# Patient Record
Sex: Female | Born: 1965 | Race: White | Hispanic: No | Marital: Married | Smoking: Never smoker
Health system: Southern US, Community
[De-identification: ages and names within clinical notes are randomized; demographics above are authoritative.]

## PROBLEM LIST (undated history)

## (undated) DIAGNOSIS — M7918 Myalgia, other site: Secondary | ICD-10-CM

## (undated) DIAGNOSIS — Z9861 Coronary angioplasty status: Principal | ICD-10-CM

## (undated) DIAGNOSIS — Z955 Presence of coronary angioplasty implant and graft: Secondary | ICD-10-CM

## (undated) DIAGNOSIS — E785 Hyperlipidemia, unspecified: Secondary | ICD-10-CM

## (undated) DIAGNOSIS — I214 Non-ST elevation (NSTEMI) myocardial infarction: Secondary | ICD-10-CM

## (undated) DIAGNOSIS — R55 Syncope and collapse: Secondary | ICD-10-CM

## (undated) DIAGNOSIS — Z8701 Personal history of pneumonia (recurrent): Secondary | ICD-10-CM

## (undated) DIAGNOSIS — I2510 Atherosclerotic heart disease of native coronary artery without angina pectoris: Principal | ICD-10-CM

## (undated) DIAGNOSIS — F419 Anxiety disorder, unspecified: Secondary | ICD-10-CM

## (undated) HISTORY — DX: Anxiety disorder, unspecified: F41.9

## (undated) HISTORY — DX: Coronary angioplasty status: Z98.61

## (undated) HISTORY — DX: Presence of coronary angioplasty implant and graft: Z95.5

## (undated) HISTORY — DX: Hyperlipidemia, unspecified: E78.5

## (undated) HISTORY — DX: Atherosclerotic heart disease of native coronary artery without angina pectoris: I25.10

## (undated) HISTORY — PX: FINGER FRACTURE SURGERY: SHX638

## (undated) HISTORY — DX: Syncope and collapse: R55

## (undated) HISTORY — DX: Personal history of pneumonia (recurrent): Z87.01

---

## 1998-10-08 ENCOUNTER — Other Ambulatory Visit: Admission: RE | Admit: 1998-10-08 | Discharge: 1998-10-08 | Payer: Self-pay | Admitting: Obstetrics and Gynecology

## 2006-05-06 ENCOUNTER — Emergency Department (HOSPITAL_COMMUNITY): Admission: EM | Admit: 2006-05-06 | Discharge: 2006-05-06 | Payer: Self-pay | Admitting: Family Medicine

## 2006-11-09 HISTORY — PX: FINGER FRACTURE SURGERY: SHX638

## 2007-07-07 ENCOUNTER — Emergency Department (HOSPITAL_COMMUNITY): Admission: EM | Admit: 2007-07-07 | Discharge: 2007-07-07 | Payer: Self-pay | Admitting: Family Medicine

## 2007-07-09 ENCOUNTER — Ambulatory Visit (HOSPITAL_COMMUNITY): Admission: RE | Admit: 2007-07-09 | Discharge: 2007-07-09 | Payer: Self-pay | Admitting: Orthopaedic Surgery

## 2008-10-21 ENCOUNTER — Inpatient Hospital Stay (HOSPITAL_COMMUNITY): Admission: AD | Admit: 2008-10-21 | Discharge: 2008-10-23 | Payer: Self-pay | Admitting: Obstetrics and Gynecology

## 2011-03-24 NOTE — Op Note (Signed)
Robin Tapia, Robin Tapia           ACCOUNT NO.:  [id number]   MEDICAL RECORD NO.:  [id number]          PATIENT TYPE:  AMB   LOCATION:  SDS                          FACILITY:  [facility]   PHYSICIAN:  Vanita Panda. Magnus Ivan, M.D.DATE OF BIRTH:  04-25-1966   DATE OF PROCEDURE:  07/09/2007  DATE OF DISCHARGE:                               OPERATIVE REPORT   PREOPERATIVE DIAGNOSIS:  Left ring finger middle phalanx condylar  fracture.   POSTOPERATIVE DIAGNOSIS:  Left ring finger middle phalanx condylar  fracture.   PROCEDURE:  Open reduction and pinning, left ring finger middle phalanx  condylar fracture.   SURGEON:  Vanita Panda. Magnus Ivan, M.D.   ANESTHESIA:  General.   ANTIBIOTICS:  1 gram of IV Ancef.   TOURNIQUET TIME:  1 hour and 48 minutes.   COMPLICATIONS:  None.   INDICATIONS:  Briefly Ms. Gauer is a 45 year old female who 4 weeks  ago sustained an injury to her ring finger when a medicine ball hit the  finger when she was working out.  She had obvious bruising and deformity  of the finger but did not seek treatment until this week.  X-rays were  obtained at [facility]. She was found to have a healing  fracture at DIP joint of the middle phalanx and this was a condylar  fracture and there was a significant stepoff.  Her finger showed a  mallet deformity as well as an ulnar-based deformity. It was recommended  she undergo open reduction and pinning with the knowledge that this was  a much more difficult case with the amount of healing that already had  taken place.  The risks and benefits were explained to her and well  understood and she agreed to surgery.   DESCRIPTION OF PROCEDURE:  After informed consent was obtained, the  appropriate left hand was marked, she was brought to the operating room  and placed supine on the operating table.  General anesthesia was then  obtained. Her hand was prepped and draped with DuraPrep and sterile  drapes, a nonsterile  tourniquet was placed around her arm. A time-out  was called and she was identified as the correct patient and correct  extremity. I then used an Esmarch to wrap out the arm, tourniquet  inflated to 250 mm pressure.  I then made an incision along the dorsal  ulnar border of the finger near the DIP joint. I was able to carry this  down between the soft tissues and assess the condylar fracture.  There  was an obvious step-off but healing tissue was easily encountered. I had  to use a small osteotome and a rongeur to free the fracture piece up to  then meticulously elevate this up to a higher position.  I then used two  0.028 K-wires in a crossing pattern to hold the fracture in a reduced  position.  This was all accomplished under fluoroscopic guidance and I  was pleased at the reduction following the pinning. Both pins were cut  outside the skin and the wound was copiously irrigated. I closed the  skin with interrupted 4-0 Prolene suture.  The pins were bent.  Xeroform  was placed over the incision followed by a well-padded sterile dressing,  Coban and a finger splint. A digital block was obtained with Marcaine.  At the end of the case, the tourniquet was let down. The patient was  awakened, extubated and taken to the recovery room in stable condition.  Postoperatively I told her I would like her to limit her activities with  her left hand to allow this to heal and to not remove the splinter  dressings until she is seen in the office next weeks.      Vanita Panda. Magnus Ivan, M.D.  Electronically Signed     CYB/MEDQ  D:  07/09/2007  T:  07/10/2007  Job:  [id number]

## 2011-07-29 ENCOUNTER — Ambulatory Visit (INDEPENDENT_AMBULATORY_CARE_PROVIDER_SITE_OTHER): Payer: Managed Care, Other (non HMO) | Admitting: Sports Medicine

## 2011-07-29 ENCOUNTER — Encounter: Payer: Self-pay | Admitting: Sports Medicine

## 2011-07-29 VITALS — BP 130/88 | HR 62 | Ht 65.0 in | Wt 140.0 lb

## 2011-07-29 DIAGNOSIS — M79672 Pain in left foot: Secondary | ICD-10-CM | POA: Insufficient documentation

## 2011-07-29 DIAGNOSIS — M79609 Pain in unspecified limb: Secondary | ICD-10-CM

## 2011-07-29 DIAGNOSIS — M216X9 Other acquired deformities of unspecified foot: Secondary | ICD-10-CM

## 2011-07-29 DIAGNOSIS — Q667 Congenital pes cavus, unspecified foot: Secondary | ICD-10-CM | POA: Insufficient documentation

## 2011-07-29 NOTE — Assessment & Plan Note (Signed)
She has used spenco inserts since age 45 rigid orthotics f podiatrist were painful  I suspect she will need cushioned insole w arch support long term or custom orthotics that are softer

## 2011-07-29 NOTE — Assessment & Plan Note (Signed)
Likely ligament injury on left dorsal foot at the navicular talus junction.  At this time, no true stress fracture seen and history does not match.   Pt given arch strap with her very high arches and will also try wearing sport insoles and scaphoid pads.  Will wear for 3-4 weeks Pt can run as tolerated  Ice as needed Pt given red flags to look out for incase involves into a stress fracture.

## 2011-07-29 NOTE — Progress Notes (Signed)
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  Pt states she did do a trail run with her family and did notice a little pain and some discomfort and that was about 2 weeks ago. Pt denies any swelling or discoloration.  Patient though does state that sometimes she feels that she has some numbness in her toes but denies any type of weakness. The patient has had a history of SI joint pain but this does not relate to it and denies any type of back or hip pain.  Patient is still able to bear weight and is continuing to do her exercises including some speed training just the other day.  Physical Exam: Ankle: No visible erythema or swelling. Range of motion is full in all directions. Strength is 5/5 in all directions. Stable lateral and medial ligaments; squeeze test and kleiger test unremarkable; Talar dome minimal tender  And tender over the navicular bone on the dorsal aspect.; No pain at base of 5th MT; No tenderness over cuboid; No tenderness on posterior aspects of lateral and medial malleolus No sign of peroneal tendon subluxations; Negative tarsal tunnel tinel's Able to walk 4 steps.  patient does  Negative bounce test - can do 10 hops  Running Gait appears normal forefoot strike with loud impact  Arch height is 3.7 cms to navicular and very cavus foot with haglund's changes bilat  MSK U/s:  On exam patient's talar dome appears to be unremarkable with no areas of stress injury or erythema.  Over the navicular bone at the navicular talus junction patient does have an area of likely ligamental strain showing some stress injury to the navicular bone but no true stress fracture seen patient does have some mild neovascularization occurring and minimal edema in this area.  Patient's extensor tendons all appear to be intact with no  swelling,  Patients abductor tendon has a bifurcation but no swelling or no tears seen.

## 2011-08-14 LAB — CBC
HCT: 35.2 % — ABNORMAL LOW (ref 36.0–46.0)
MCHC: 34.3 g/dL (ref 30.0–36.0)
Platelets: 225 10*3/uL (ref 150–400)
RBC: 4.02 MIL/uL (ref 3.87–5.11)
RDW: 14 % (ref 11.5–15.5)
WBC: 11.7 10*3/uL — ABNORMAL HIGH (ref 4.0–10.5)

## 2011-08-14 LAB — RPR: RPR Ser Ql: NONREACTIVE

## 2011-08-21 LAB — CBC
HCT: 43.3
Hemoglobin: 14.7
RBC: 4.76
WBC: 7.6

## 2013-03-12 HISTORY — PX: CORONARY ANGIOPLASTY WITH STENT PLACEMENT: SHX49

## 2013-04-09 DIAGNOSIS — Z9861 Coronary angioplasty status: Secondary | ICD-10-CM

## 2013-04-09 DIAGNOSIS — I2510 Atherosclerotic heart disease of native coronary artery without angina pectoris: Secondary | ICD-10-CM

## 2013-04-09 HISTORY — DX: Atherosclerotic heart disease of native coronary artery without angina pectoris: Z98.61

## 2013-04-09 HISTORY — DX: Atherosclerotic heart disease of native coronary artery without angina pectoris: I25.10

## 2013-04-11 ENCOUNTER — Emergency Department (HOSPITAL_COMMUNITY)
Admission: EM | Admit: 2013-04-11 | Discharge: 2013-04-11 | Disposition: A | Discharge status: Nursing Home (Includes ICF) | Payer: Managed Care, Other (non HMO) | Source: Home / Self Care | Attending: Emergency Medicine | Admitting: Emergency Medicine

## 2013-04-11 ENCOUNTER — Encounter (HOSPITAL_COMMUNITY): Payer: Self-pay | Admitting: *Deleted

## 2013-04-11 ENCOUNTER — Encounter (HOSPITAL_COMMUNITY): Payer: Self-pay | Admitting: Emergency Medicine

## 2013-04-11 ENCOUNTER — Emergency Department (HOSPITAL_COMMUNITY): Payer: Managed Care, Other (non HMO)

## 2013-04-11 ENCOUNTER — Inpatient Hospital Stay (HOSPITAL_COMMUNITY)
Admission: EM | Admit: 2013-04-11 | Discharge: 2013-04-14 | DRG: 247 | Disposition: A | Discharge status: Home / Self Care | Payer: Managed Care, Other (non HMO) | Attending: Internal Medicine | Admitting: Internal Medicine

## 2013-04-11 DIAGNOSIS — R079 Chest pain, unspecified: Secondary | ICD-10-CM

## 2013-04-11 DIAGNOSIS — I9589 Other hypotension: Secondary | ICD-10-CM | POA: Diagnosis not present

## 2013-04-11 DIAGNOSIS — R778 Other specified abnormalities of plasma proteins: Secondary | ICD-10-CM

## 2013-04-11 DIAGNOSIS — I214 Non-ST elevation (NSTEMI) myocardial infarction: Secondary | ICD-10-CM

## 2013-04-11 DIAGNOSIS — I519 Heart disease, unspecified: Secondary | ICD-10-CM | POA: Diagnosis not present

## 2013-04-11 DIAGNOSIS — Y840 Cardiac catheterization as the cause of abnormal reaction of the patient, or of later complication, without mention of misadventure at the time of the procedure: Secondary | ICD-10-CM | POA: Diagnosis not present

## 2013-04-11 DIAGNOSIS — R7989 Other specified abnormal findings of blood chemistry: Secondary | ICD-10-CM

## 2013-04-11 DIAGNOSIS — I209 Angina pectoris, unspecified: Secondary | ICD-10-CM

## 2013-04-11 DIAGNOSIS — I2510 Atherosclerotic heart disease of native coronary artery without angina pectoris: Secondary | ICD-10-CM | POA: Diagnosis present

## 2013-04-11 DIAGNOSIS — Z955 Presence of coronary angioplasty implant and graft: Secondary | ICD-10-CM

## 2013-04-11 DIAGNOSIS — I498 Other specified cardiac arrhythmias: Secondary | ICD-10-CM | POA: Diagnosis not present

## 2013-04-11 HISTORY — DX: Myalgia, other site: M79.18

## 2013-04-11 HISTORY — DX: Non-ST elevation (NSTEMI) myocardial infarction: I21.4

## 2013-04-11 LAB — CBC
HCT: 41.4 % (ref 36.0–46.0)
Hemoglobin: 14.8 g/dL (ref 12.0–15.0)
MCH: 31.2 pg (ref 26.0–34.0)
MCV: 87.3 fL (ref 78.0–100.0)
RBC: 4.74 MIL/uL (ref 3.87–5.11)

## 2013-04-11 LAB — COMPREHENSIVE METABOLIC PANEL
ALT: 18 U/L (ref 0–35)
AST: 29 U/L (ref 0–37)
Albumin: 3.9 g/dL (ref 3.5–5.2)
CO2: 24 mEq/L (ref 19–32)
Calcium: 9.3 mg/dL (ref 8.4–10.5)
Sodium: 140 mEq/L (ref 135–145)
Total Protein: 7.1 g/dL (ref 6.0–8.3)

## 2013-04-11 LAB — CK TOTAL AND CKMB (NOT AT ARMC): Total CK: 275 U/L — ABNORMAL HIGH (ref 7–177)

## 2013-04-11 LAB — BASIC METABOLIC PANEL
CO2: 26 mEq/L (ref 19–32)
Calcium: 9.3 mg/dL (ref 8.4–10.5)
Creatinine, Ser: 0.73 mg/dL (ref 0.50–1.10)
Glucose, Bld: 93 mg/dL (ref 70–99)

## 2013-04-11 LAB — TROPONIN I: Troponin I: 0.93 ng/mL (ref <0.30)

## 2013-04-11 MED ORDER — SODIUM CHLORIDE 0.9 % IV SOLN: INTRAVENOUS | Status: DC

## 2013-04-11 MED ORDER — ALPRAZOLAM 0.25 MG PO TABS
0.2500 mg | ORAL_TABLET | Freq: Two times a day (BID) | ORAL | Status: DC | PRN
  Administered 2013-04-12: 0.25 mg via ORAL
  Filled 2013-04-11 (×2): qty 1

## 2013-04-11 MED ORDER — VITAMIN C 250 MG PO TABS
250.0000 mg | ORAL_TABLET | Freq: Every day | ORAL | Status: DC
  Filled 2013-04-11 (×4): qty 1

## 2013-04-11 MED ORDER — HEPARIN BOLUS VIA INFUSION
4000.0000 [IU] | Freq: Once | INTRAVENOUS | Status: AC
  Administered 2013-04-11: 4000 [IU] via INTRAVENOUS
  Filled 2013-04-11: qty 4000

## 2013-04-11 MED ORDER — ACETAMINOPHEN 325 MG PO TABS: 650.0000 mg | ORAL_TABLET | ORAL | Status: DC | PRN

## 2013-04-11 MED ORDER — NITROGLYCERIN 0.4 MG SL SUBL: 0.4000 mg | SUBLINGUAL_TABLET | SUBLINGUAL | Status: DC | PRN

## 2013-04-11 MED ORDER — HEPARIN (PORCINE) IN NACL 100-0.45 UNIT/ML-% IJ SOLN
900.0000 [IU]/h | INTRAMUSCULAR | Status: DC
  Administered 2013-04-11: 900 [IU]/h via INTRAVENOUS
  Filled 2013-04-11: qty 250

## 2013-04-11 MED ORDER — ONDANSETRON HCL 4 MG/2ML IJ SOLN: 4.0000 mg | Freq: Four times a day (QID) | INTRAMUSCULAR | Status: DC | PRN

## 2013-04-11 MED ORDER — ASPIRIN 81 MG PO CHEW
CHEWABLE_TABLET | ORAL | Status: AC
  Filled 2013-04-11: qty 2

## 2013-04-11 MED ORDER — ASPIRIN 81 MG PO CHEW
162.0000 mg | CHEWABLE_TABLET | Freq: Once | ORAL | Status: AC
  Administered 2013-04-11: 162 mg via ORAL

## 2013-04-11 MED ORDER — ZOLPIDEM TARTRATE 5 MG PO TABS
5.0000 mg | ORAL_TABLET | Freq: Once | ORAL | Status: AC
  Administered 2013-04-11: 5 mg via ORAL
  Filled 2013-04-11: qty 1

## 2013-04-11 MED ORDER — ASPIRIN EC 81 MG PO TBEC
81.0000 mg | DELAYED_RELEASE_TABLET | Freq: Every day | ORAL | Status: DC
  Filled 2013-04-11: qty 1

## 2013-04-11 MED ORDER — ZOLPIDEM TARTRATE 5 MG PO TABS
5.0000 mg | ORAL_TABLET | Freq: Every evening | ORAL | Status: DC | PRN
  Administered 2013-04-12 – 2013-04-13 (×2): 5 mg via ORAL
  Filled 2013-04-11 (×2): qty 1

## 2013-04-11 NOTE — ED Notes (Signed)
Sheldon, MD at bedside.  

## 2013-04-11 NOTE — ED Notes (Signed)
Pt  Reports    Chest  Pain     Described       As  A  Tightness      In  Chest    And  An  Inability  yest to  Take  A  Deep breath      After  Exertion  -  Pt is  A  Runner       And  Has  Been  Training   -  She  Reports  Earlier today  After treadmill  She  Developed  A  Sensation     Of     Not  Being  able  To take  Deep  Breath    She  Also  Reports   A    Strange      Sensation      Described  As    A  Pain   At  This  Time  She  Is  Awake  alert  And  Oriented

## 2013-04-11 NOTE — ED Notes (Signed)
Pt reports left chest and upper back pain radiating to left arm after running intervals yesterday and this morning. Pt reports pain was relieved somewhat with deep breathing. Pt currently NAd, alert, oriented x4. Transferred from urgent care with normal EKG for further eval.

## 2013-04-11 NOTE — ED Notes (Signed)
Results of i-stat troponin given to Dr. Bernette Mayers

## 2013-04-11 NOTE — Consult Note (Signed)
Pt. Seen and examined. Agree with the NP/PA-C note as written.  Called to the ER to see an unassigned 47 yo female with no significant medical problems in the past. As she relates the story, she has been "training hard" for a series of half-marathons (one with her daughter in [place]). She was running on the treadmill this morning and noticed left peri-scapular pain, this went around to her chest and she noted "aching" in her left arm.  She was doing sprint training at 12 mph. When she stopped exercising, her symptoms went away. She went back to exercise and they became apparent again. She has never had symptoms like this before. She did not that she did some overhead painting for her daughter recently and has been lifting some heavier weights than usual.  She presented to [facility] initially. Her EKG is normal, without ischemia. POC troponin was obtained and was slightly elevated at 0.09. Other labs were WNL. Currently she is chest pain free. She has refused heparin. Exam significant for point tenderness under the medial edge of the left scapula - pressure on this point does relieve some of her arm ache. Otherwise, exam is benign.   Impression: 1. Possible unstable angina - vs. Musculoskeletal chest pain/radiculopathy 2. Elevated troponin - may represent ACS vs. Skeletal myopathy  Plan: 1.  I would like to gather more information before rendering a diagnosis. Her symptoms sound like unstable angina, however, her EKG, CXR and exam argue against it. POC troponin is elevated. It would be helpful to get a lab standard troponin, as well as CK and CKMB to r/o rhabdomyolysis or skeletal myopathy. If troponin remains or becomes more positive, then we have to consider her as "failing her stress test" and LHC is warranted tomorrow morning. If her tests become negative, she could probably have further outpatient work-up of her chest wall pain.  Haniyyah Sakuma C. Heily Carlucci, MD, FACC Attending Cardiologist [facility]   

## 2013-04-11 NOTE — ED Notes (Signed)
Pt refuses IV at this time, Bernette Mayers, MD notified

## 2013-04-11 NOTE — Consult Note (Signed)
Reason for Consult: Chest Pain  Referring Physician: ER Physician   HPI: The patient is a 47 y/o female, that presented to the [facility] ER today with a complaint of exertional chest pain that began today. She has no cardiac risk factors. No past medical history. She is a physically active, physically fit female. 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 She reports that she was running on the treadmill this morning and experienced exertional chest pressure while doing interval training. She has never experienced this type of discomfort before while exercising. She notes that she had associated left hand pressure and left scapular pain. The pain was relieved with rest and then recurred with continued running. She took two ASA at home and then reported to the [facility] after the pain failed to resolve. She was instructed to come to the ER. Her resting EKG was normal. POC troponin was minimally elevated at 0.09. She currently denies chest pain, but endorses left scapular pain with palpation. No SOB, diaphoresis, n/v, or presyncope. She denies any family history of early CAD, MI or SCD.  History reviewed. No pertinent past medical history.  History reviewed. No pertinent past surgical history.  Family History  Problem Relation Age of Onset  . Hypertension Father     Social History:  reports that she has never smoked. She has never used smokeless tobacco. She reports that she does not drink alcohol or use illicit drugs.  Allergies: No Known Allergies  Medications: Prior to Admission:  Prior to Admission medications   Medication Sig Start Date End Date Taking? Authorizing Provider  Ascorbic Acid (VITAMIN C PO) Take 1 tablet by mouth daily.   Yes Historical Provider, MD     Results for orders placed during the hospital encounter of 04/11/13 (from the past 48 hour(s))  CBC     Status: None   Collection Time    04/11/13 12:57 PM      Result Value Range   WBC 9.0  4.0 - 10.5 K/uL    RBC 4.74  3.87 - 5.11 MIL/uL   Hemoglobin 14.8  12.0 - 15.0 g/dL   HCT 16.1  09.6 - 04.5 %   MCV 87.3  78.0 - 100.0 fL   MCH 31.2  26.0 - 34.0 pg   MCHC 35.7  30.0 - 36.0 g/dL   RDW 40.9  81.1 - 91.4 %   Platelets 260  150 - 400 K/uL  BASIC METABOLIC PANEL     Status: None   Collection Time    04/11/13 12:57 PM      Result Value Range   Sodium 137  135 - 145 mEq/L   Potassium 4.3  3.5 - 5.1 mEq/L   Chloride 103  96 - 112 mEq/L   CO2 26  19 - 32 mEq/L   Glucose, Bld 93  70 - 99 mg/dL   BUN 17  6 - 23 mg/dL   Creatinine, Ser 7.82  0.50 - 1.10 mg/dL   Calcium 9.3  8.4 - 95.6 mg/dL   GFR calc non Af Amer >90  >90 mL/min   GFR calc Af Amer >90  >90 mL/min   Comment:            The eGFR has been calculated     using the CKD EPI equation.     This calculation has not been     validated in all clinical     situations.     eGFR's persistently     <  90 mL/min signify     possible Chronic Kidney Disease.  POCT I-STAT TROPONIN I     Status: Abnormal   Collection Time    04/11/13  1:56 PM      Result Value Range   Troponin i, poc 0.09 (*) 0.00 - 0.08 ng/mL   Comment NOTIFIED PHYSICIAN     Comment 3            Comment: Due to the release kinetics of cTnI,     a negative result within the first hours     of the onset of symptoms does not rule out     myocardial infarction with certainty.     If myocardial infarction is still suspected,     repeat the test at appropriate intervals.    No results found.  Review of Systems  Constitutional: Negative for diaphoresis.  Respiratory: Negative for shortness of breath.   Cardiovascular: Positive for chest pain.  Gastrointestinal: Negative for nausea and vomiting.  Musculoskeletal: Positive for myalgias and back pain.  Neurological: Negative for loss of consciousness.   Blood pressure 130/83, pulse 79, temperature 98.3 F (36.8 C), resp. rate 18, weight 138 lb (62.596 kg), last menstrual period 03/21/2013, SpO2 100.00%. Physical Exam   Constitutional: She is oriented to person, place, and time. She appears well-developed and well-nourished. No distress.  HENT:  Head: Normocephalic and atraumatic.  Cardiovascular: Normal rate, regular rhythm, normal heart sounds and intact distal pulses.  Exam reveals no gallop and no friction rub.   No murmur heard. Respiratory: Effort normal and breath sounds normal. No respiratory distress. She has no wheezes. She has no rales. She exhibits no tenderness.  Musculoskeletal: She exhibits no edema.  Neurological: She is alert and oriented to person, place, and time.  Skin: Skin is warm and dry. She is not diaphoretic.  Psychiatric: She has a normal mood and affect. Her behavior is normal.    Assessment/Plan: Principal Problem:   Chest pain with moderate risk for cardiac etiology  Plan: Currently CP free. Resting EKG normal. POC troponin minimally elevated at 0.09. Pt is refusing heparin. Will admit to observation and continue w/u to rule out MI. Will cycle troponin's, as well as CK and CKMB. Will order a CMP. Will obtain a repeat EKG in the AM.   Robin Tapia. SIMMONS, PA-C 04/11/2013, 3:57 PM

## 2013-04-11 NOTE — ED Notes (Signed)
Portable Xray at bedside.

## 2013-04-11 NOTE — Progress Notes (Signed)
CRITICAL VALUE ALERT  Critical value received:  Troponin 0.93       CKMB 20.4       Total 275  Date of notification:  04/11/13  Time of notification:  2040  Critical value read back:yes  Nurse who received alert:  Nikki Dom, RN  MD notified (1st page):  Wilburt Finlay, PA  Time of first page:  2040  MD notified (2nd page): n/a  Time of second page: n/a  Responding MD:  Wilburt Finlay, PA  Time MD responded:  2045  IV heparin started

## 2013-04-11 NOTE — ED Provider Notes (Signed)
History     CSN: [id number]  Arrival date & time 04/11/13  1242   First MD Initiated Contact with Patient 04/11/13 1427      Chief Complaint  Patient presents with  . Chest Pain    (Consider location/radiation/quality/duration/timing/severity/associated sxs/prior treatment) Patient is a 47 y.o. female presenting with chest pain.  Chest Pain  Pt with no significant PMH or FH reports she was running several sprints on treadmill this AM. States she had episodes of chest tightness and back pain after each one. Some SOB and sweating she attributed to running hard. She had similar episodes each time she ran, improved with resting between runs. She states on her way home she had aching pain in L arm. She is asymptomatic now, went to [facility] and was sent to the ED for further evaluation. No CAD or PE risk factors. PERC neg.    History reviewed. No pertinent past medical history.  History reviewed. No pertinent past surgical history.  History reviewed. No pertinent family history.  History  Substance Use Topics  . Smoking status: Never Smoker   . Smokeless tobacco: Never Used  . Alcohol Use: No    OB History   Grav Para Term Preterm Abortions TAB SAB Ect Mult Living                  Review of Systems  Cardiovascular: Positive for chest pain.   All other systems reviewed and are negative except as noted in HPI.    Allergies  Review of patient's allergies indicates no known allergies.  Home Medications   Current Outpatient Rx  Name  Route  Sig  Dispense  Refill  . Ascorbic Acid (VITAMIN C PO)   Oral   Take 1 tablet by mouth daily.           BP 144/82  Pulse 70  Temp(Src) 98.3 F (36.8 C)  Resp 16  Wt 138 lb (62.596 kg)  BMI 22.96 kg/m2  SpO2 100%  LMP 03/21/2013  Physical Exam  Nursing note and vitals reviewed. Constitutional: She is oriented to person, place, and time. She appears well-developed and well-nourished.  HENT:  Head: Normocephalic and atraumatic.   Eyes: EOM are normal. Pupils are equal, round, and reactive to light.  Neck: Normal range of motion. Neck supple.  Cardiovascular: Normal rate, normal heart sounds and intact distal pulses.   Pulmonary/Chest: Effort normal and breath sounds normal.  Abdominal: Bowel sounds are normal. She exhibits no distension. There is no tenderness.  Musculoskeletal: Normal range of motion. She exhibits no edema and no tenderness.  Neurological: She is alert and oriented to person, place, and time. She has normal strength. No cranial nerve deficit or sensory deficit.  Skin: Skin is warm and dry. No rash noted.  Psychiatric: She has a normal mood and affect.    ED Course  Procedures (including critical care time)  Labs Reviewed  POCT I-STAT TROPONIN I - Abnormal; Notable for the following:    Troponin i, poc 0.09 (*)    All other components within normal limits  CBC  BASIC METABOLIC PANEL   No results found.   1. NSTEMI (non-ST elevated myocardial infarction)       MDM   Date: 04/11/2013  Rate: 77  Rhythm: normal sinus rhythm  QRS Axis: normal  Intervals: normal  ST/T Wave abnormalities: normal  Conduction Disutrbances: none  Narrative Interpretation: unremarkable  Pt asymptomatic now, EKG normal, however symptoms are concerning for angina and POC  Trop is positive. Discussed this finding with the patient and need for cardiology evaluation which she is amenable to, however, she refuses IV insertion for heparin. She understands that heparin may prevent continued myocardiac damage, but states he feels fine now and doesn't want IV. Discussed with [facility] on call for cardiology who will see the patient.           Raney Antwine B. Bernette Mayers, MD 04/11/13 1550

## 2013-04-11 NOTE — Progress Notes (Signed)
ANTICOAGULATION CONSULT NOTE - Initial Consult  Pharmacy Consult for heparin Indication: chest pain/ACS  No Known Allergies  Patient Measurements: Height: 5\' 5"  (165.1 cm) Weight: 141 lb 4.8 oz (64.093 kg) IBW/kg (Calculated) : 57 Heparin Dosing Weight: 64 kg  Vital Signs: Temp: 98.6 F (37 C) (06/03 1750) Temp src: Oral (06/03 1750) BP: 127/79 mmHg (06/03 1750) Pulse Rate: 60 (06/03 1750)  Labs:  Recent Labs  04/11/13 1257 04/11/13 1805 04/11/13 1806  HGB 14.8  --   --   HCT 41.4  --   --   PLT 260  --   --   CREATININE 0.73 0.71  --   CKTOTAL  --   --  275*  CKMB  --   --  20.4*  TROPONINI  --   --  0.93*    Estimated Creatinine Clearance: 78.2 ml/min (by C-G formula based on Cr of 0.71).   Medical History: Past Medical History  Diagnosis Date  . Pneumonia ~ 2002    'walking" (04/11/2013)  . Piriformis muscle pain     LLE (04/11/2013)    Medications:  Prescriptions prior to admission  Medication Sig Dispense Refill  . Ascorbic Acid (VITAMIN C PO) Take 1 tablet by mouth daily.        Assessment: 47 year old woman admitted for chest pain and elevated troponins to start on heparin infusion. Goal of Therapy:  Heparin level 0.3-0.7 units/ml Monitor platelets by anticoagulation protocol: Yes   Plan:  Give 4000 units bolus x 1 Start heparin infusion at 900 units/hr Check anti-Xa level in 6 hours and daily while on heparin Continue to monitor H&H and platelets  Mickeal Skinner 04/11/2013,8:19 PM

## 2013-04-11 NOTE — ED Provider Notes (Addendum)
Chief Complaint:   Chief Complaint  Patient presents with  . Chest Pain    History of Present Illness:   Robin Tapia is a 47 year old female runner with no risk factors who had episode yesterday and today of chest tightness and pressure while running. The discomfort first began in the left upper back and radiated to the left pectoral area then down the left arm yesterday while running and lasted 40 minutes. It was associated with slight nausea and shortness of breath. After she stopped running the discomfort went away completely. Today she tried some less strenuous running and had the same discomfort recur again, lasting about 30 minutes at all. Right now she is pain-free. She has no history of cardiac disease. No history of high blood pressure, diabetes, high cholesterol, smoking, or family history of heart disease.  Review of Systems:  Other than noted above, the patient denies any of the following symptoms. Systemic:  No fever, chills, sweats, or fatigue. ENT:  No nasal congestion, rhinorrhea, or sore throat. Pulmonary:  No cough, wheezing, shortness of breath, sputum production, hemoptysis. Cardiac:  No palpitations, rapid heartbeat, dizziness, presyncope or syncope. GI:  No abdominal pain, heartburn, nausea, or vomiting. Ext:  No leg pain or swelling.  PMFSH:  Past medical history, family history, social history, meds, and allergies were reviewed and updated as needed.   Physical Exam:   Vital signs:  BP 127/76  Pulse 75  Temp(Src) 97.9 F (36.6 C) (Oral)  Resp 16  SpO2 100%  LMP 03/21/2013 Gen:  Alert, oriented, in no distress, skin warm and dry. Eye:  PERRL, lids and conjunctivas normal.  Sclera non-icteric. ENT:  Mucous membranes moist, pharynx clear. Neck:  Supple, no adenopathy or tenderness.  No JVD. Lungs:  Clear to auscultation, no wheezes, rales or rhonchi.  No respiratory distress. Heart:  Regular rhythm.  No gallops, murmers, clicks or rubs. Chest:  No chest  wall tenderness. Abdomen:  Soft, nontender, no organomegaly or mass.  Bowel sounds normal.  No pulsatile abdominal mass or bruit. Ext:  No edema.  No calf tenderness and Homann's sign negative.  Pulses full and equal. Skin:  Warm and dry.  No rash.  EKG:   Date: 04/11/2013  Rate: 77  Rhythm: normal sinus rhythm  QRS Axis: normal  Intervals: normal  ST/T Wave abnormalities: normal  Conduction Disutrbances:none  Narrative Interpretation: Normal sinus rhythm, normal EKG.  Old EKG Reviewed: none available  Course in Urgent Care Center:   She was started on IV normal saline at 50 mL per hour monitored, given oxygen at 2-3 minutes, and given aspirin 162 mg, since she had already taken 2 baby aspirin at home. She'll be transferred to the emergency department by [facility].  Assessment:  The encounter diagnosis was Angina pectoris.  She has no risk factors and she is a runner, but her story is very suspicious for angina. The other possibility would be pulmonary embolus.  Plan:   1.  The following meds were prescribed:   New Prescriptions   No medications on file   2.  The patient was transferred to the emergency department via [facility] in stable condition.  Medical Decision Making:  47 year old female with no risk factors had left upper chest and left upper back tightness yesterday and today while running.  Lasted 40 minutes, associated with nausea and shortness of breath.  Radiated down left arm.  EKG was normal, but history suspicious for angina.    Dineen Kid  Lorenz Coaster, MD 04/11/13 1218  Addendum: The patient declines transfer by [facility]. She also declines IV, oxygen, and monitor. Her husband is with her and will drive her to the emergency room or, if we can talk her into it we'll send her by shuttle. I told her that this was against our protocol and against my better judgment, but she is adamant and refuses to be transferred in the usual manner.  Reuben Likes, MD 04/11/13 1228

## 2013-04-11 NOTE — ED Notes (Signed)
Attempted to call report x 1  

## 2013-04-12 ENCOUNTER — Encounter (HOSPITAL_COMMUNITY)
Admission: EM | Disposition: A | Discharge status: Home / Self Care | Payer: Self-pay | Source: Home / Self Care | Attending: Internal Medicine

## 2013-04-12 DIAGNOSIS — I214 Non-ST elevation (NSTEMI) myocardial infarction: Secondary | ICD-10-CM

## 2013-04-12 DIAGNOSIS — I2510 Atherosclerotic heart disease of native coronary artery without angina pectoris: Secondary | ICD-10-CM

## 2013-04-12 DIAGNOSIS — Z955 Presence of coronary angioplasty implant and graft: Secondary | ICD-10-CM

## 2013-04-12 HISTORY — PX: LEFT HEART CATHETERIZATION WITH CORONARY ANGIOGRAM: SHX5451

## 2013-04-12 HISTORY — DX: Non-ST elevation (NSTEMI) myocardial infarction: I21.4

## 2013-04-12 HISTORY — DX: Presence of coronary angioplasty implant and graft: Z95.5

## 2013-04-12 LAB — CK TOTAL AND CKMB (NOT AT ARMC)
CK, MB: 37 ng/mL (ref 0.3–4.0)
CK, MB: 38.7 ng/mL (ref 0.3–4.0)
Relative Index: 7.7 — ABNORMAL HIGH (ref 0.0–2.5)
Relative Index: 7.1 — ABNORMAL HIGH (ref 0.0–2.5)
Total CK: 543 U/L — ABNORMAL HIGH (ref 7–177)

## 2013-04-12 LAB — BASIC METABOLIC PANEL
CO2: 23 mEq/L (ref 19–32)
Calcium: 9 mg/dL (ref 8.4–10.5)
Creatinine, Ser: 0.79 mg/dL (ref 0.50–1.10)
GFR calc Af Amer: >90 mL/min (ref >90)
GFR calc non Af Amer: >90 mL/min (ref >90)
Sodium: 139 mEq/L (ref 135–145)

## 2013-04-12 LAB — LIPID PANEL
Cholesterol: 196 mg/dL (ref 0–200)
Triglycerides: 151 mg/dL — ABNORMAL HIGH (ref <150)
VLDL: 30 mg/dL (ref 0–40)

## 2013-04-12 LAB — HEPARIN LEVEL (UNFRACTIONATED): Heparin Unfractionated: 0.36 IU/mL (ref 0.30–0.70)

## 2013-04-12 LAB — PREGNANCY, URINE: Preg Test, Ur: NEGATIVE

## 2013-04-12 LAB — TROPONIN I
Troponin I: 7.2 ng/mL (ref <0.30)
Troponin I: 9.95 ng/mL (ref <0.30)

## 2013-04-12 LAB — POCT ACTIVATED CLOTTING TIME: Activated Clotting Time: 415 seconds

## 2013-04-12 LAB — PROTIME-INR: INR: 1.04 (ref 0.00–1.49)

## 2013-04-12 LAB — MRSA PCR SCREENING: MRSA by PCR: NEGATIVE

## 2013-04-12 SURGERY — LEFT HEART CATHETERIZATION WITH CORONARY ANGIOGRAM
Anesthesia: LOCAL

## 2013-04-12 MED ORDER — SODIUM CHLORIDE 0.9 % IV SOLN
1.0000 mL/kg/h | INTRAVENOUS | Status: DC
  Administered 2013-04-12: 1 mL/kg/h via INTRAVENOUS

## 2013-04-12 MED ORDER — ONDANSETRON HCL 4 MG/2ML IJ SOLN: 4.0000 mg | Freq: Four times a day (QID) | INTRAMUSCULAR | Status: DC | PRN

## 2013-04-12 MED ORDER — SODIUM CHLORIDE 0.9 % IV SOLN
0.2500 mg/kg/h | INTRAVENOUS | Status: AC
  Filled 2013-04-12: qty 250

## 2013-04-12 MED ORDER — ASPIRIN EC 81 MG PO TBEC: 81.0000 mg | DELAYED_RELEASE_TABLET | Freq: Every day | ORAL | Status: DC

## 2013-04-12 MED ORDER — VERAPAMIL HCL 2.5 MG/ML IV SOLN
INTRAVENOUS | Status: AC
  Filled 2013-04-12: qty 2

## 2013-04-12 MED ORDER — ACETAMINOPHEN 325 MG PO TABS
650.0000 mg | ORAL_TABLET | ORAL | Status: DC | PRN
  Administered 2013-04-12: 650 mg via ORAL
  Filled 2013-04-12: qty 2

## 2013-04-12 MED ORDER — FENTANYL CITRATE 0.05 MG/ML IJ SOLN
INTRAMUSCULAR | Status: AC
  Filled 2013-04-12: qty 2

## 2013-04-12 MED ORDER — PRASUGREL HCL 10 MG PO TABS
ORAL_TABLET | ORAL | Status: AC
  Administered 2013-04-13: 10 mg via ORAL
  Filled 2013-04-12: qty 6

## 2013-04-12 MED ORDER — HEPARIN (PORCINE) IN NACL 2-0.9 UNIT/ML-% IJ SOLN
INTRAMUSCULAR | Status: AC
  Filled 2013-04-12: qty 1000

## 2013-04-12 MED ORDER — ATROPINE SULFATE 1 MG/ML IJ SOLN
INTRAMUSCULAR | Status: AC
  Filled 2013-04-12: qty 1

## 2013-04-12 MED ORDER — BIVALIRUDIN 250 MG IV SOLR
INTRAVENOUS | Status: AC
  Filled 2013-04-12: qty 250

## 2013-04-12 MED ORDER — ATORVASTATIN CALCIUM 80 MG PO TABS
80.0000 mg | ORAL_TABLET | Freq: Every day | ORAL | Status: DC
  Administered 2013-04-12 – 2013-04-13 (×2): 80 mg via ORAL
  Filled 2013-04-12 (×3): qty 1

## 2013-04-12 MED ORDER — SODIUM CHLORIDE 0.9 % IJ SOLN
3.0000 mL | Freq: Two times a day (BID) | INTRAMUSCULAR | Status: DC
  Administered 2013-04-12: 3 mL via INTRAVENOUS

## 2013-04-12 MED ORDER — SODIUM CHLORIDE 0.9 % IV SOLN
INTRAVENOUS | Status: AC
  Administered 2013-04-12: 12:00:00 via INTRAVENOUS
  Administered 2013-04-12: 75 mL/h via INTRAVENOUS

## 2013-04-12 MED ORDER — SODIUM CHLORIDE 0.9 % IJ SOLN: 3.0000 mL | INTRAMUSCULAR | Status: DC | PRN

## 2013-04-12 MED ORDER — MIDAZOLAM HCL 2 MG/2ML IJ SOLN
INTRAMUSCULAR | Status: AC
  Filled 2013-04-12: qty 2

## 2013-04-12 MED ORDER — METOPROLOL TARTRATE 12.5 MG HALF TABLET
12.5000 mg | ORAL_TABLET | Freq: Two times a day (BID) | ORAL | Status: DC
  Administered 2013-04-12 – 2013-04-13 (×3): 12.5 mg via ORAL
  Filled 2013-04-12 (×7): qty 1

## 2013-04-12 MED ORDER — SODIUM CHLORIDE 0.9 % IV SOLN: 250.0000 mL | INTRAVENOUS | Status: DC | PRN

## 2013-04-12 MED ORDER — PRASUGREL HCL 10 MG PO TABS
10.0000 mg | ORAL_TABLET | Freq: Every day | ORAL | Status: DC
  Filled 2013-04-12 (×2): qty 1

## 2013-04-12 MED ORDER — ASPIRIN 81 MG PO CHEW
324.0000 mg | CHEWABLE_TABLET | Freq: Once | ORAL | Status: AC
  Administered 2013-04-12: 324 mg via ORAL

## 2013-04-12 MED ORDER — ASPIRIN 81 MG PO CHEW
324.0000 mg | CHEWABLE_TABLET | ORAL | Status: DC
  Filled 2013-04-12: qty 4

## 2013-04-12 MED ORDER — HEPARIN SODIUM (PORCINE) 1000 UNIT/ML IJ SOLN
INTRAMUSCULAR | Status: AC
  Filled 2013-04-12: qty 1

## 2013-04-12 MED ORDER — ASPIRIN EC 325 MG PO TBEC
325.0000 mg | DELAYED_RELEASE_TABLET | Freq: Every day | ORAL | Status: DC
  Administered 2013-04-13: 325 mg via ORAL
  Filled 2013-04-12 (×2): qty 1

## 2013-04-12 MED ORDER — DIAZEPAM 5 MG PO TABS
5.0000 mg | ORAL_TABLET | Freq: Once | ORAL | Status: AC
  Administered 2013-04-12: 5 mg via ORAL
  Filled 2013-04-12: qty 1

## 2013-04-12 MED ORDER — LIDOCAINE HCL (PF) 1 % IJ SOLN
INTRAMUSCULAR | Status: AC
  Filled 2013-04-12: qty 30

## 2013-04-12 MED ORDER — MORPHINE SULFATE 2 MG/ML IJ SOLN
1.0000 mg | INTRAMUSCULAR | Status: DC | PRN
  Administered 2013-04-12: 1 mg via INTRAVENOUS
  Filled 2013-04-12: qty 1

## 2013-04-12 NOTE — Care Management Note (Signed)
    Page 1 of 1   04/12/2013     1:35:17 PM   CARE MANAGEMENT NOTE 04/12/2013  Patient:  Robin Tapia, Robin Tapia   Account Number:  [id number]  Date Initiated:  04/12/2013  Documentation initiated by:  Junius Creamer  Subjective/Objective Assessment:   adm w mi     Action/Plan:   lives w husband   Anticipated DC Date:     Anticipated DC Plan:        DC Planning Services  CM consult  Medication Assistance      Choice offered to / List presented to:             Status of service:   Medicare Important Message given?   (If response is "NO", the following Medicare IM given date fields will be blank) Date Medicare IM given:   Date Additional Medicare IM given:    Discharge Disposition:  HOME/SELF CARE  Per UR Regulation:  Reviewed for med. necessity/level of care/duration of stay  If discussed at Long Length of Stay Meetings, dates discussed:    Comments:  6/4 1334 debbie Konnor Vondrasek rn,bsn spoke w pt. gave her effeint 30day free and copay assist card. pt states maybe in 100% so may not have copay for effient.

## 2013-04-12 NOTE — Progress Notes (Signed)
ANTICOAGULATION CONSULT NOTE  Pharmacy Consult for heparin Indication: chest pain/ACS  No Known Allergies  Patient Measurements: Height: 5\' 5"  (165.1 cm) Weight: 143 lb 14.4 oz (65.273 kg) IBW/kg (Calculated) : 57 Heparin Dosing Weight: 64 kg  Vital Signs: Temp: 97.8 F (36.6 C) (06/04 0426) Temp src: Oral (06/04 0426) BP: 114/61 mmHg (06/04 0426) Pulse Rate: 72 (06/04 0426)  Labs:  Recent Labs  04/11/13 1257 04/11/13 1805 04/11/13 1806 04/11/13 2321 04/12/13 0520  HGB 14.8  --   --   --   --   HCT 41.4  --   --   --   --   PLT 260  --   --   --   --   LABPROT  --   --   --   --  13.5  INR  --   --   --   --  1.04  HEPARINUNFRC  --   --   --   --  0.36  CREATININE 0.73 0.71  --   --  0.79  CKTOTAL  --   --  275* 481* 543*  CKMB  --   --  20.4* 37.0* 38.7*  TROPONINI  --   --  0.93* 7.20* 9.95*    Estimated Creatinine Clearance: 78.2 ml/min (by C-G formula based on Cr of 0.79).  Assessment: 47 year old woman with chest pain for heparin  Goal of Therapy:  Heparin level 0.3-0.7 units/ml Monitor platelets by anticoagulation protocol: Yes   Plan:  Continue Heparin at current rate F/U after cath  Eddie Candle 04/12/2013,6:47 AM

## 2013-04-12 NOTE — H&P (Signed)
Pt. Seen and examined. Agree with the NP/PA-C note as written.  Called to the ER to see an unassigned 47 yo female with no significant medical problems in the past. As she relates the story, she has been "training hard" for a series of half-marathons (one with her daughter in [place]). She was running on the treadmill this morning and noticed left peri-scapular pain, this went around to her chest and she noted "aching" in her left arm.  She was doing sprint training at 12 mph. When she stopped exercising, her symptoms went away. She went back to exercise and they became apparent again. She has never had symptoms like this before. She did not that she did some overhead painting for her daughter recently and has been lifting some heavier weights than usual.  She presented to [facility] initially. Her EKG is normal, without ischemia. POC troponin was obtained and was slightly elevated at 0.09. Other labs were WNL. Currently she is chest pain free. She has refused heparin. Exam significant for point tenderness under the medial edge of the left scapula - pressure on this point does relieve some of her arm ache. Otherwise, exam is benign.   Impression: 1. Possible unstable angina - vs. Musculoskeletal chest pain/radiculopathy 2. Elevated troponin - may represent ACS vs. Skeletal myopathy  Plan: 1.  I would like to gather more information before rendering a diagnosis. Her symptoms sound like unstable angina, however, her EKG, CXR and exam argue against it. POC troponin is elevated. It would be helpful to get a lab standard troponin, as well as CK and CKMB to r/o rhabdomyolysis or skeletal myopathy. If troponin remains or becomes more positive, then we have to consider her as "failing her stress test" and LHC is warranted tomorrow morning. If her tests become negative, she could probably have further outpatient work-up of her chest wall pain.  Chrystie Nose, MD, [facility] Attending Cardiologist [facility]

## 2013-04-12 NOTE — Progress Notes (Signed)
Subjective:  No further CP.  Objective:  Temp:  [97.8 F (36.6 C)-98.6 F (37 C)] 97.8 F (36.6 C) (06/04 0426) Pulse Rate:  [60-79] 72 (06/04 0426) Resp:  [13-19] 18 (06/03 1750) BP: (114-145)/(61-86) 114/61 mmHg (06/04 0426) SpO2:  [99 %-100 %] 100 % (06/04 0426) Weight:  [138 lb (62.596 kg)-143 lb 14.4 oz (65.273 kg)] 143 lb 14.4 oz (65.273 kg) (06/04 0426) Weight change:   Intake/Output from previous day:    Intake/Output from this shift:    Physical Exam: General appearance: alert and no distress Neck: no adenopathy, no carotid bruit, no JVD, supple, symmetrical, trachea midline and thyroid not enlarged, symmetric, no tenderness/mass/nodules Lungs: clear to auscultation bilaterally Heart: regular rate and rhythm, S1, S2 normal, no murmur, click, rub or gallop Extremities: extremities normal, atraumatic, no cyanosis or edema Pulses: 2+ and symmetric  Lab Results: Results for orders placed during the hospital encounter of 04/11/13 (from the past 48 hour(s))  CBC     Status: None   Collection Time    04/11/13 12:57 PM      Result Value Range   WBC 9.0  4.0 - 10.5 K/uL   RBC 4.74  3.87 - 5.11 MIL/uL   Hemoglobin 14.8  12.0 - 15.0 g/dL   HCT 16.1  09.6 - 04.5 %   MCV 87.3  78.0 - 100.0 fL   MCH 31.2  26.0 - 34.0 pg   MCHC 35.7  30.0 - 36.0 g/dL   RDW 40.9  81.1 - 91.4 %   Platelets 260  150 - 400 K/uL  BASIC METABOLIC PANEL     Status: None   Collection Time    04/11/13 12:57 PM      Result Value Range   Sodium 137  135 - 145 mEq/L   Potassium 4.3  3.5 - 5.1 mEq/L   Chloride 103  96 - 112 mEq/L   CO2 26  19 - 32 mEq/L   Glucose, Bld 93  70 - 99 mg/dL   BUN 17  6 - 23 mg/dL   Creatinine, Ser 7.82  0.50 - 1.10 mg/dL   Calcium 9.3  8.4 - 95.6 mg/dL   GFR calc non Af Amer >90  >90 mL/min   GFR calc Af Amer >90  >90 mL/min   Comment:            The eGFR has been calculated     using the CKD EPI equation.     This calculation has not been     validated in all  clinical     situations.     eGFR's persistently     <90 mL/min signify     possible Chronic Kidney Disease.  POCT I-STAT TROPONIN I     Status: Abnormal   Collection Time    04/11/13  1:56 PM      Result Value Range   Troponin i, poc 0.09 (*) 0.00 - 0.08 ng/mL   Comment NOTIFIED PHYSICIAN     Comment 3            Comment: Due to the release kinetics of cTnI,     a negative result within the first hours     of the onset of symptoms does not rule out     myocardial infarction with certainty.     If myocardial infarction is still suspected,     repeat the test at appropriate intervals.  COMPREHENSIVE METABOLIC PANEL     Status: None  Collection Time    04/11/13  6:05 PM      Result Value Range   Sodium 140  135 - 145 mEq/L   Potassium 3.8  3.5 - 5.1 mEq/L   Chloride 107  96 - 112 mEq/L   CO2 24  19 - 32 mEq/L   Glucose, Bld 83  70 - 99 mg/dL   BUN 15  6 - 23 mg/dL   Creatinine, Ser 1.61  0.50 - 1.10 mg/dL   Calcium 9.3  8.4 - 09.6 mg/dL   Total Protein 7.1  6.0 - 8.3 g/dL   Albumin 3.9  3.5 - 5.2 g/dL   AST 29  0 - 37 U/L   ALT 18  0 - 35 U/L   Alkaline Phosphatase 56  39 - 117 U/L   Total Bilirubin 0.5  0.3 - 1.2 mg/dL   GFR calc non Af Amer >90  >90 mL/min   GFR calc Af Amer >90  >90 mL/min   Comment:            The eGFR has been calculated     using the CKD EPI equation.     This calculation has not been     validated in all clinical     situations.     eGFR's persistently     <90 mL/min signify     possible Chronic Kidney Disease.  TROPONIN I     Status: Abnormal   Collection Time    04/11/13  6:06 PM      Result Value Range   Troponin I 0.93 (*) <0.30 ng/mL   Comment:            Due to the release kinetics of cTnI,     a negative result within the first hours     of the onset of symptoms does not rule out     myocardial infarction with certainty.     If myocardial infarction is still suspected,     repeat the test at appropriate intervals.     CRITICAL  RESULT CALLED TO, READ BACK BY AND VERIFIED WITH:     Lovey Newcomer 1923 04/11/13 WBOND  CK TOTAL AND CKMB     Status: Abnormal   Collection Time    04/11/13  6:06 PM      Result Value Range   Total CK 275 (*) 7 - 177 U/L   CK, MB 20.4 (*) 0.3 - 4.0 ng/mL   Comment: CRITICAL RESULT CALLED TO, READ BACK BY AND VERIFIED WITH:     Jerrol Banana 04/11/13 WBOND   Relative Index 7.4 (*) 0.0 - 2.5  TROPONIN I     Status: Abnormal   Collection Time    04/11/13 11:21 PM      Result Value Range   Troponin I 7.20 (*) <0.30 ng/mL   Comment:            Due to the release kinetics of cTnI,     a negative result within the first hours     of the onset of symptoms does not rule out     myocardial infarction with certainty.     If myocardial infarction is still suspected,     repeat the test at appropriate intervals.     CRITICAL VALUE NOTED.  VALUE IS CONSISTENT WITH PREVIOUSLY REPORTED AND CALLED VALUE.  CK TOTAL AND CKMB     Status: Abnormal   Collection Time    04/11/13 11:21 PM  Result Value Range   Total CK 481 (*) 7 - 177 U/L   CK, MB 37.0 (*) 0.3 - 4.0 ng/mL   Comment: CRITICAL VALUE NOTED.  VALUE IS CONSISTENT WITH PREVIOUSLY REPORTED AND CALLED VALUE.   Relative Index 7.7 (*) 0.0 - 2.5  TROPONIN I     Status: Abnormal   Collection Time    04/12/13  5:20 AM      Result Value Range   Troponin I 9.95 (*) <0.30 ng/mL   Comment:            Due to the release kinetics of cTnI,     a negative result within the first hours     of the onset of symptoms does not rule out     myocardial infarction with certainty.     If myocardial infarction is still suspected,     repeat the test at appropriate intervals.     CRITICAL VALUE NOTED.  VALUE IS CONSISTENT WITH PREVIOUSLY REPORTED AND CALLED VALUE.  CK TOTAL AND CKMB     Status: Abnormal   Collection Time    04/12/13  5:20 AM      Result Value Range   Total CK 543 (*) 7 - 177 U/L   CK, MB 38.7 (*) 0.3 - 4.0 ng/mL   Comment: CRITICAL  VALUE NOTED.  VALUE IS CONSISTENT WITH PREVIOUSLY REPORTED AND CALLED VALUE.   Relative Index 7.1 (*) 0.0 - 2.5  HEPARIN LEVEL (UNFRACTIONATED)     Status: None   Collection Time    04/12/13  5:20 AM      Result Value Range   Heparin Unfractionated 0.36  0.30 - 0.70 IU/mL   Comment:            IF HEPARIN RESULTS ARE BELOW     EXPECTED VALUES, AND PATIENT     DOSAGE HAS BEEN CONFIRMED,     SUGGEST FOLLOW UP TESTING     OF ANTITHROMBIN III LEVELS.  BASIC METABOLIC PANEL     Status: Abnormal   Collection Time    04/12/13  5:20 AM      Result Value Range   Sodium 139  135 - 145 mEq/L   Potassium 4.3  3.5 - 5.1 mEq/L   Chloride 107  96 - 112 mEq/L   CO2 23  19 - 32 mEq/L   Glucose, Bld 111 (*) 70 - 99 mg/dL   BUN 14  6 - 23 mg/dL   Creatinine, Ser 5.40  0.50 - 1.10 mg/dL   Calcium 9.0  8.4 - 98.1 mg/dL   GFR calc non Af Amer >90  >90 mL/min   GFR calc Af Amer >90  >90 mL/min   Comment:            The eGFR has been calculated     using the CKD EPI equation.     This calculation has not been     validated in all clinical     situations.     eGFR's persistently     <90 mL/min signify     possible Chronic Kidney Disease.  LIPID PANEL     Status: Abnormal   Collection Time    04/12/13  5:20 AM      Result Value Range   Cholesterol 196  0 - 200 mg/dL   Triglycerides 191 (*) <150 mg/dL   HDL 59  >47 mg/dL   Total CHOL/HDL Ratio 3.3     VLDL 30  0 -  40 mg/dL   LDL Cholesterol 366 (*) 0 - 99 mg/dL   Comment:            Total Cholesterol/HDL:CHD Risk     Coronary Heart Disease Risk Table                         Men   Women      1/2 Average Risk   3.4   3.3      Average Risk       5.0   4.4      2 X Average Risk   9.6   7.1      3 X Average Risk  23.4   11.0                Use the calculated Patient Ratio     above and the CHD Risk Table     to determine the patient's CHD Risk.                ATP III CLASSIFICATION (LDL):      <100     mg/dL   Optimal      440-347  mg/dL    Near or Above                        Optimal      130-159  mg/dL   Borderline      425-956  mg/dL   High      >387     mg/dL   Very High  PROTIME-INR     Status: None   Collection Time    04/12/13  5:20 AM      Result Value Range   Prothrombin Time 13.5  11.6 - 15.2 seconds   INR 1.04  0.00 - 1.49    Imaging: Imaging results have been reviewed  Assessment/Plan:   1. Principal Problem: 2.   Chest pain with moderate risk for cardiac etiology 3.   Time Spent Directly with Patient:  30 minutes  Length of Stay:  LOS: 1 day   NSTEMI during exercise. No EKG changes. Trop 9. On IV hep. Labs OK. Exam benign. Plan cor angio via RRA approach. Discussed risks and benefits with pt and family. Suspect Takasubo vs SCAD.   Runell Gess 04/12/2013, 9:28 AM

## 2013-04-12 NOTE — CV Procedure (Signed)
Robin Tapia is a 47 y.o. female    [id number] LOCATION:  FACILITY: [facility]  PHYSICIAN: Nanetta Batty, M.D. 09/08/1966   DATE OF PROCEDURE:  04/12/2013  DATE OF DISCHARGE:  [facility]  CARDIAC CATHETERIZATION     History obtained from chart review.The patient is a 47 y/o female, that presented to the [facility] ER today with a complaint of exertional chest pain that began today. She has no cardiac risk factors. No past medical history. She is a physically active, physically fit female. 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 She reports that she was running on the treadmill this morning and experienced exertional chest pressure while doing interval training. She has never experienced this type of discomfort before while exercising. She notes that she had associated left hand pressure and left scapular pain. The pain was relieved with rest and then recurred with continued running. She took two ASA at home and then reported to the [facility] after the pain failed to resolve. She was instructed to come to the ER. Her resting EKG was normal. POC troponin was minimally elevated at 0.09. She currently denies chest pain, but endorses left scapular pain with palpation. No SOB, diaphoresis, n/v, or presyncope. She denies any family history of early CAD, MI or SCD. Her troponins increased to 10. She is placed on IV heparin. She presents now for cardiac catheterization via the right radial approach. Risks and benefits were explained to the patient and her husband and sister.    PROCEDURE DESCRIPTION:    The patient was brought to the second floor [place] [facility]iac cath lab in the postabsorptive state. She was premedicated withValium 5 mg by mouth, IV Versed and fentanyl. Her right wrist was prepped and shaved in usual sterile fashion. Xylocaine 1% was used for local anesthesia. A 6 French sheath was inserted into the right radial artery using standard Seldinger  technique. The patient received 4000 units  of heparin  intravenously.  A 5 [place] TIG catheter along with a 5 French pigtail catheter were used for selective Cori angiography and left ventriculography respectively. Visipaque dye was used for the entirety of the case. Retrograde aortic, left ventricular and pullback pressures were recorded.    HEMODYNAMICS:    AO SYSTOLIC/AO DIASTOLIC: 125/81   LV SYSTOLIC/LV DIASTOLIC: 126/7  ANGIOGRAPHIC RESULTS:   1. Left main; normal  2. LAD; the LAD itself had only minor irregularities. The first diagonal branch was a large bifurcating vessel is occluded at the first bifurcation with a failed left to left collaterals. 3. Left circumflex; nondominant but large. 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  4. Right coronary artery; dominant and free of significant disease 5. Left ventriculography; RAO left ventriculogram was performed using  25 mL of Visipaque dye at 12 mL/second. The overall LVEF estimated  45-50 %  With wall motion abnormalities notable for moderate anterolateral hypokinesia in the diagonal branch territory  IMPRESSION:Robin Tapia has an occluded large diagonal branch presenting her infarct related artery with a corresponding wall motion the normality of a high-grade mid AV groove circumflex. Will proceed with PCI stenting using drug-eluting stent, aspirin,  Effient , at Angiomax.  Procedure description: I initially used a 6 [place] XB 3.5 followed by 3.0 guide catheter.the patient received Effient and 60 mg by mouth load followed by Angiomax bolus and ACT documented at 415. Total contrast administered to the patient with 225 cc. During guide catheter exchanges the patient became  bradycardic, hypotensive and had ST segment elevation. She received a milligram of atropine and the guide catheter was changed for a 6 [place] at [facility] guide catheter. Angiography revealed mild reperfusion of the diagonal branch probably responsible for her symptoms.  Following this I crossed the lesion with an 014/190 cm longAsahi Prowater  guidewire. I predilated the circumflex with a 2.0/12 mm long balloon and stented with a 3.0/23 mm Xpedition drug-eluting stent at 14 atmospheres. Following this I postdilated with a 3.25 mm/20 mm long noncompliant balloon at 14-16 atmospheres resulting in reduction of a 95% stenosis to 0% residual (3.3 mm).  Following this I redirected the wire down the diagonal branch and crossed the lesion with little difficulty. I dilated with a 1.5 x 12 mm long balloon to establish flow and upgraded to a 2.0 x 12 mm balloon. Following this I stented with a 2.5 mm/15 mm long Xpedition drug-eluting stent deployed at 14-16 atmospheres (2.4 mm) resulting in reduction of a total occlusion to 0% residual with excellent flow. The patient tolerated the procedure well.  Final impression: Successful circumflex PCI and stenting using expedition drug-eluting stent and diagonal branch PCI and stenting using expedition drug-eluting stent. Angiomax was reduced to low dose for 4 hours and then will be discontinued. The she then be removed at a PRP and will be placed on the wrist. The patient will spend the night in the unit 2900 for observation and will be "fast track". She'll be treated with aspirin, beta blocker, statin drug and ace inhibitor. She left the lab in stable condition.   Robin Gess MD, [facility] 04/12/2013 12:11 PM

## 2013-04-12 NOTE — H&P (Signed)
    Pt was reexamined and existing H & P reviewed. No changes found.  Runell Gess, MD [facility] 04/12/2013 10:06 AM

## 2013-04-13 ENCOUNTER — Encounter (HOSPITAL_COMMUNITY): Payer: Self-pay | Admitting: Cardiology

## 2013-04-13 DIAGNOSIS — Z9861 Coronary angioplasty status: Secondary | ICD-10-CM

## 2013-04-13 DIAGNOSIS — I2510 Atherosclerotic heart disease of native coronary artery without angina pectoris: Secondary | ICD-10-CM | POA: Diagnosis present

## 2013-04-13 DIAGNOSIS — I214 Non-ST elevation (NSTEMI) myocardial infarction: Secondary | ICD-10-CM

## 2013-04-13 LAB — BASIC METABOLIC PANEL
BUN: 11 mg/dL (ref 6–23)
Chloride: 105 mEq/L (ref 96–112)
GFR calc Af Amer: >90 mL/min (ref >90)
GFR calc non Af Amer: 79 mL/min — ABNORMAL LOW (ref >90)
Potassium: 4.1 mEq/L (ref 3.5–5.1)
Sodium: 139 mEq/L (ref 135–145)

## 2013-04-13 LAB — CBC
MCHC: 34 g/dL (ref 30.0–36.0)
Platelets: 236 10*3/uL (ref 150–400)
RDW: 13 % (ref 11.5–15.5)
WBC: 8.3 10*3/uL (ref 4.0–10.5)

## 2013-04-13 MED FILL — Sodium Chloride IV Soln 0.9%: INTRAVENOUS | Qty: 50 | Status: AC

## 2013-04-13 NOTE — Progress Notes (Signed)
[facility]  Subjective: No further chest pain. Eager to be discharged home.   Objective: Vital signs in last 24 hours: Temp:  [98.1 F (36.7 C)-98.7 F (37.1 C)] 98.3 F (36.8 C) (06/05 0400) Pulse Rate:  [48-65] 58 (06/05 0600) Resp:  [11-18] 17 (06/05 0600) BP: (89-135)/(47-83) 112/66 mmHg (06/05 0600) SpO2:  [99 %-100 %] 99 % (06/05 0600) Last BM Date: 04/12/13  Intake/Output from previous day: 06/04 0701 - 06/05 0700 In: 1245 [P.O.:720; I.V.:525] Out: -  Intake/Output this shift:    Medications Current Facility-Administered Medications  Medication Dose Route Frequency Provider Last Rate Last Dose  . acetaminophen (TYLENOL) tablet 650 mg  650 mg Oral Q4H PRN Runell Gess, MD   650 mg at 04/12/13 2009  . ALPRAZolam Prudy Feeler) tablet 0.25 mg  0.25 mg Oral BID PRN Brittainy Simmons, PA-C   0.25 mg at 04/12/13 0736  . aspirin EC tablet 325 mg  325 mg Oral Daily Runell Gess, MD      . atorvastatin (LIPITOR) tablet 80 mg  80 mg Oral q1800 Runell Gess, MD   80 mg at 04/12/13 1622  . metoprolol tartrate (LOPRESSOR) tablet 12.5 mg  12.5 mg Oral BID Runell Gess, MD   12.5 mg at 04/12/13 2211  . morphine 2 MG/ML injection 1 mg  1 mg Intravenous Q1H PRN Runell Gess, MD   1 mg at 04/12/13 1317  . nitroGLYCERIN (NITROSTAT) SL tablet 0.4 mg  0.4 mg Sublingual Q5 Min x 3 PRN Brittainy Simmons, PA-C      . ondansetron (ZOFRAN) injection 4 mg  4 mg Intravenous Q6H PRN Runell Gess, MD      . prasugrel (EFFIENT) tablet 10 mg  10 mg Oral Daily Runell Gess, MD      . vitamin C (ASCORBIC ACID) tablet 250 mg  250 mg Oral Daily Brittainy Simmons, PA-C      . zolpidem (AMBIEN) tablet 5 mg  5 mg Oral QHS PRN Robbie Lis, PA-C   5 mg at 04/12/13 2211    PE: General appearance: alert, cooperative and no distress Lungs: clear to auscultation bilaterally Heart: regular rate and rhythm Extremities: no LEE, right radial access site  is free of complication Pulses: 2+ and symmetric Skin: warm and dry Neurologic: Grossly normal  Lab Results:   Recent Labs  04/11/13 1257 04/13/13 0345  WBC 9.0 8.3  HGB 14.8 13.0  HCT 41.4 38.2  PLT 260 236   BMET  Recent Labs  04/11/13 1805 04/12/13 0520 04/13/13 0345  NA 140 139 139  K 3.8 4.3 4.1  CL 107 107 105  CO2 24 23 26   GLUCOSE 83 111* 97  BUN 15 14 11   CREATININE 0.71 0.79 0.86  CALCIUM 9.3 9.0 9.2   PT/INR  Recent Labs  04/12/13 0520  LABPROT 13.5  INR 1.04   Cholesterol  Recent Labs  04/12/13 0520  CHOL 196   Cardiac Enzymes Cardiac Panel (last 3 results)  Recent Labs  04/11/13 1806 04/11/13 2321 04/12/13 0520  CKTOTAL 275* 481* 543*  CKMB 20.4* 37.0* 38.7*  TROPONINI 0.93* 7.20* 9.95*  RELINDX 7.4* 7.7* 7.1*    Studies/Results: HEMODYNAMICS:  AO SYSTOLIC/AO DIASTOLIC: 125/81  LV SYSTOLIC/LV DIASTOLIC: 126/7  ANGIOGRAPHIC RESULTS:  1. Left main; normal  2. LAD; the LAD itself had only minor irregularities. The first diagonal branch was a large bifurcating vessel is occluded at the first bifurcation with a failed  left to left collaterals.  3. Left circumflex; nondominant but large. 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  4. Right coronary artery; dominant and free of significant disease  5. Left ventriculography; RAO left ventriculogram was performed using  25 mL of Visipaque dye at 12 mL/second. The overall LVEF estimated  45-50 % With wall motion abnormalities notable for moderate anterolateral hypokinesia in the diagonal branch territory   Assessment/Plan  Active Problems:   NSTEMI - S/P PCI and stenting x 2 of Circumflex and Diagonal Branch w/ DES 04/12/13  Plan: NSTEMI--->STEMI. Cath yesterday revealed a totally occluded diagonal branch and a 95% mid AV groove stenosis. Both lesions treated with PCI with DES. On DAPT with ASA + Effient. Pt became bradycardic and hypotensive in cath lab, after reperfusion, necessitating   the use of Atropine. Her HR and BP normalized and she left the cath lab in stable condition. She was transferred to an ICU bed. No events overnight. Currently CP free. Right radial access site is stable. Exam benign. Labs WNL. HR and BP stable. NSR on telemetry. No arrhthymias. Pt was adamant about being dischaged today. Dr. [name] had a long detailed discussion with the patient and her family on the findings and events of yesterday's cath procedure. He stressed the importance of staying in the hospital for an additional night for further observation. The patient now is in agreement.  Will transfer out of ICU today and to a telemetry bed. Will have cardiac rehab assess and help ambulate. If stable, plan for discharge home tomorrow. Continue on BB and statin.    LOS: 2 days   Brittainy M. Sharol Harness, PA-C 04/13/2013 9:08 AM  I have seen and evaluated the patient this AM along with Boyce Medici, PA. I agree with her findings, examination as well as impression recommendations.  I was present in the Cath lab with Dr. Allyson Sabal yesterday -- PCI to OM ~90% lesion, but also to 100% occluded large diag2.  -- pathophysiologically STEMI, but just not noted on pre-cath ECG.   She feels great today.  Dr. Hazle Coca plan was d/c in AM.  I spent ~25 min talking with the pt & family re: the reasoning behind this decision & the need to "take things easy" during therecovery period..   Transfer to Tele today. Allow her to shower.  Will ask [facility] to work with her -- explain recovery plan -- I doubt she will need OP [facility].  Will keep on low dose BB & Statin along with DAPT.   MD Time with pt: 25 min, 5 min on chart.  Marykay Lex, M.D., M.S. [facility] [address] [place], [place]  [address]  [phone] Pager # [phone] 04/13/2013 9:25 AM

## 2013-04-13 NOTE — Progress Notes (Signed)
CARDIAC REHAB PHASE I   PRE:  Rate/Rhythm: 59 SB    BP: sitting 133/71    SaO2:   MODE:  Ambulation: 700 ft   POST:  Rate/Rhythm: 84 SR    BP: sitting 125/75     SaO2:   No problems ambulating. Frustrated with restrictions. Sts she has never gone more than 3 days without running/jogging. Ed completed. Encouraged to follow Rating of Perceived Exertion scale as she is walking. Pt will be very anxious to start jogging again. Encouraged her to follow cardiologists recommendations on f/u. Absolutely not interested in CRPII. To walk independently on unit today. [phone]   Elissa Lovett Jefferson CES, ACSM 04/13/2013 11:37 AM

## 2013-04-14 ENCOUNTER — Telehealth: Payer: Self-pay | Admitting: *Deleted

## 2013-04-14 ENCOUNTER — Encounter: Payer: Self-pay | Admitting: Cardiology

## 2013-04-14 ENCOUNTER — Ambulatory Visit (INDEPENDENT_AMBULATORY_CARE_PROVIDER_SITE_OTHER): Payer: Managed Care, Other (non HMO) | Admitting: Cardiology

## 2013-04-14 VITALS — BP 152/80 | HR 98

## 2013-04-14 DIAGNOSIS — F4310 Post-traumatic stress disorder, unspecified: Secondary | ICD-10-CM | POA: Insufficient documentation

## 2013-04-14 DIAGNOSIS — I214 Non-ST elevation (NSTEMI) myocardial infarction: Secondary | ICD-10-CM

## 2013-04-14 DIAGNOSIS — R55 Syncope and collapse: Secondary | ICD-10-CM | POA: Insufficient documentation

## 2013-04-14 MED ORDER — ALPRAZOLAM 0.25 MG PO TABS: 0.2500 mg | ORAL_TABLET | Freq: Three times a day (TID) | ORAL | Status: DC | PRN

## 2013-04-14 MED ORDER — VENLAFAXINE HCL ER 75 MG PO CP24: 75.0000 mg | ORAL_CAPSULE | Freq: Every day | ORAL | Status: DC

## 2013-04-14 MED ORDER — PRASUGREL HCL 10 MG PO TABS: 10.0000 mg | ORAL_TABLET | Freq: Every day | ORAL | Status: DC

## 2013-04-14 MED ORDER — ASPIRIN 325 MG PO TBEC: 325.0000 mg | DELAYED_RELEASE_TABLET | Freq: Every day | ORAL | Status: DC

## 2013-04-14 MED ORDER — NITROGLYCERIN 0.4 MG SL SUBL: 0.4000 mg | SUBLINGUAL_TABLET | SUBLINGUAL | Status: DC | PRN

## 2013-04-14 MED ORDER — ATORVASTATIN CALCIUM 80 MG PO TABS: 80.0000 mg | ORAL_TABLET | Freq: Every day | ORAL | Status: DC

## 2013-04-14 MED ORDER — METOPROLOL SUCCINATE ER 25 MG PO TB24: 12.5000 mg | ORAL_TABLET | Freq: Every day | ORAL | Status: DC

## 2013-04-14 NOTE — Assessment & Plan Note (Signed)
No further anginal symptoms (back pain, lt arm pain)

## 2013-04-14 NOTE — Discharge Summary (Signed)
Physician Discharge Summary  Patient ID: Robin Tapia MRN: [id number] DOB/AGE: 05-17-1966 47 y.o.  Admit date: 04/11/2013 Discharge date: 04/14/2013  Admission Diagnoses: NSTEMI  Discharge Diagnoses:  Principal Problem:   NSTEMI - S/P PCI and stenting x 2 of Circumflex and Diagonal Branch w/ DES 04/12/13   Discharged Condition: stable  Hospital Course: The patient is a 47 y/o female, with no cardiac risk factors and no past medical history, prior to hospitalization, who was admitted on 04/11/13 for NSTEMI. 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 When she initially presented, she endorsed exertional substernal chest pain with radiation to the left shoulder, scapula and entire left arm, while running on her treadmill at home. She had denied any exertional chest pain prior to this episode. The pain was relieved with rest, but recurred when she started running again. On arrival to the ED, her EKG showed normal sinus rhythm, with no acute changes. Her initial troponin was a point of care troponin and was minimally elevated at 0.09. At the time of first encounter, the patient was chest pain free and asking to go home. However, admission for observation was recommenced. Initially, the patient was refusing heparin. She ultimately ruled in for NSTEMI. Her second actual lab troponin was also positive at 0.93 and increased to 10. She was started on IV heparin. She underwent a left heart catheretization, performed by Dr. Allyson Sabal, via the right radial artery. During guide catheter exchanges the patient became bradycardic, hypotensive and had ST segment elevation. She received a milligram of atropine and her HR increased appropriately. She was found to have an occluded large diagonal branch presenting her infarct related artery, as well as a high-grade mid AV groove circumflex, which was 95% stenosed.  She underwent successful PCI and stenting of the  two lesions using DES. The overall LVEF was estimated at 45-50%, with wall motion abnormalities notable for moderate anterolateral hypokinesia in the diagonal branch territory. She left the cath lab in stable condition.  She was transferred to the CCU for further monitoring. She had no further complications and no further chest pain. The right radial access site remained stable. She ambulated with cardiac rehab without difficulty. She was then transferred to telemetry and had no further problems. On hospital day 3, she was last seen and examined by Dr. Allyson Sabal, who determined that she was stable for discharge home. She was discharged on DAPT with ASA and Effient, Toprol XL and Lipitor. She was advised to refrain from running and marathon training for 6-8 weeks. She will follow up with Corine Shelter, PA-C, on 04/20/13.     Consults: None  Significant Diagnostic Studies:   LHC 04/12/13 HEMODYNAMICS:  AO SYSTOLIC/AO DIASTOLIC: 125/81  LV SYSTOLIC/LV DIASTOLIC: 126/7  ANGIOGRAPHIC RESULTS:  1. Left main; normal  2. LAD; the LAD itself had only minor irregularities. The first diagonal branch was a large bifurcating vessel is occluded at the first bifurcation with a failed left to left collaterals.  3. Left circumflex; nondominant but large. 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  4. Right coronary artery; dominant and free of significant disease  5. Left ventriculography; RAO left ventriculogram was performed using  25 mL of Visipaque dye at 12 mL/second. The overall LVEF estimated  45-50 % With wall motion abnormalities notable for moderate anterolateral hypokinesia in the diagonal branch territory   Treatments: See Hospital Course  Discharge Exam: Blood pressure 118/78, pulse 86,  temperature 97.7 F (36.5 C), temperature source Oral, resp. rate 17, height 5\' 5"  (1.651 m), weight 140 lb (63.504 kg), last menstrual period 03/21/2013, SpO2 99.00%.   Disposition: Home      Discharge Orders    Future Appointments Provider Department Dept Phone   04/20/2013 8:00 AM Abelino Derrick, [place] [facility] [place] [phone]   05/09/2013 8:00 AM Marykay Lex, [place] [facility] [place] [phone]   Future Orders Complete By Expires     Diet - low sodium heart healthy  As directed     Increase activity slowly  As directed         Medication List    TAKE these medications       aspirin 325 MG EC tablet  Take 1 tablet (325 mg total) by mouth daily.     atorvastatin 80 MG tablet  Commonly known as:  LIPITOR  Take 1 tablet (80 mg total) by mouth daily at 6 PM.     metoprolol succinate 25 MG 24 hr tablet  Commonly known as:  TOPROL XL  Take 0.5 tablets (12.5 mg total) by mouth daily.     nitroGLYCERIN 0.4 MG SL tablet  Commonly known as:  NITROSTAT  Place 1 tablet (0.4 mg total) under the tongue every 5 (five) minutes x 3 doses as needed for chest pain.     prasugrel 10 MG Tabs  Commonly known as:  EFFIENT  Take 1 tablet (10 mg total) by mouth daily.     VITAMIN C PO  Take 1 tablet by mouth daily.       Follow-up Information   Follow up with Runell Gess, MD. (our office will call you to arrange a follow-up appointment)    Contact information:   [address] [place] [address] [phone]      TIME SPENT ON DISCHARGE, INCLUDING PHYSICIAN TIME: >30 MINUTES  Signed: Allayne Butcher, PA-C 04/19/2013, 7:05 PM

## 2013-04-14 NOTE — Patient Instructions (Signed)
Keep well hydrated. Start Effexor 75mg  daily. Xanax 0.25mg  three times a day if needed for anxiety. Follow up with Dr [name] in three weeks.

## 2013-04-14 NOTE — Progress Notes (Signed)
Subjective:  No CP/SOB  Objective:  Temp:  [97.7 F (36.5 C)-98.5 F (36.9 C)] 97.7 F (36.5 C) (06/06 0429) Pulse Rate:  [48-86] 86 (06/06 0429) Resp:  [12-17] 17 (06/06 0429) BP: (107-125)/(67-78) 118/78 mmHg (06/06 0429) SpO2:  [98 %-100 %] 99 % (06/06 0429) Weight:  [140 lb (63.504 kg)] 140 lb (63.504 kg) (06/05 1215) Weight change:   Intake/Output from previous day: 06/05 0701 - 06/06 0700 In: 480 [P.O.:480] Out: -   Intake/Output from this shift:    Physical Exam: General appearance: alert and no distress Neck: no adenopathy, no carotid bruit, no JVD, supple, symmetrical, trachea midline and thyroid not enlarged, symmetric, no tenderness/mass/nodules Lungs: clear to auscultation bilaterally Heart: regular rate and rhythm, S1, S2 normal, no murmur, click, rub or gallop Extremities: extremities normal, atraumatic, no cyanosis or edema and right radial puncture site OK  Lab Results: Results for orders placed during the hospital encounter of 04/11/13 (from the past 48 hour(s))  PREGNANCY, URINE     Status: None   Collection Time    04/12/13  9:49 AM      Result Value Range   Preg Test, Ur NEGATIVE  NEGATIVE   Comment:            THE SENSITIVITY OF THIS     METHODOLOGY IS >20 mIU/mL.  POCT ACTIVATED CLOTTING TIME     Status: None   Collection Time    04/12/13 10:51 AM      Result Value Range   Activated Clotting Time 415    MRSA PCR SCREENING     Status: None   Collection Time    04/12/13 12:55 PM      Result Value Range   MRSA by PCR NEGATIVE  NEGATIVE   Comment:            The GeneXpert MRSA Assay (FDA     approved for NASAL specimens     only), is one component of a     comprehensive MRSA colonization     surveillance program. It is not     intended to diagnose MRSA     infection nor to guide or     monitor treatment for     MRSA infections.  CBC     Status: None   Collection Time    04/13/13  3:45 AM      Result Value Range   WBC 8.3  4.0 - 10.5  K/uL   RBC 4.30  3.87 - 5.11 MIL/uL   Hemoglobin 13.0  12.0 - 15.0 g/dL   HCT 54.0  98.1 - 19.1 %   MCV 88.8  78.0 - 100.0 fL   MCH 30.2  26.0 - 34.0 pg   MCHC 34.0  30.0 - 36.0 g/dL   RDW 47.8  29.5 - 62.1 %   Platelets 236  150 - 400 K/uL  BASIC METABOLIC PANEL     Status: Abnormal   Collection Time    04/13/13  3:45 AM      Result Value Range   Sodium 139  135 - 145 mEq/L   Potassium 4.1  3.5 - 5.1 mEq/L   Chloride 105  96 - 112 mEq/L   CO2 26  19 - 32 mEq/L   Glucose, Bld 97  70 - 99 mg/dL   BUN 11  6 - 23 mg/dL   Creatinine, Ser 3.08  0.50 - 1.10 mg/dL   Calcium 9.2  8.4 - 65.7 mg/dL   GFR  calc non Af Amer 79 (*) >90 mL/min   GFR calc Af Amer >90  >90 mL/min   Comment:            The eGFR has been calculated     using the CKD EPI equation.     This calculation has not been     validated in all clinical     situations.     eGFR's persistently     <90 mL/min signify     possible Chronic Kidney Disease.    Imaging: Imaging results have been reviewed  Assessment/Plan:   1. Principal Problem: 2.   NSTEMI - S/P PCI and stenting x 2 of Circumflex and Diagonal Branch w/ DES 04/12/13 3. Active Problems: 4.   Presence of bare metal stent in left circumflex coronary artery & Diagonal 2 branch of LAD 5.   Time Spent Directly with Patient:  20 minutes  Length of Stay:  LOS: 3 days   OK for DC home this AM on DAPT, statin and low dose BB if VS tolerate. Back to see me 1-2 weeks.  Runell Gess 04/14/2013, 7:36 AM

## 2013-04-14 NOTE — Telephone Encounter (Signed)
Spoke to [place] about pt's RX for Xanax that was supposed to be sent into the pharmacy, but had not been received. Verbal Rx for the supply, seen in Epic, was given to pharmacy.

## 2013-04-14 NOTE — Assessment & Plan Note (Signed)
Severe anxiety, vagal reaction after NSTEMI, PCI

## 2013-04-14 NOTE — Telephone Encounter (Signed)
Pt stated she is having fainting spells.  Stated she feels like she is about to pass out.  Stated she just got home from the hospital after having a stent.  Hinda Glatter, PA-C notified and advised pt come in now for evaluation.  Pt agreed w/ plan.  Appt scheduled for 1:40pm.

## 2013-04-14 NOTE — Progress Notes (Signed)
04/14/2013 Robin Tapia   Mar 23, 1966  [id number]  Primary Physicia No PCP Per Patient Primary Cardiologist: Dr [name]  HPI:  The patient is a 47 y/o female, that presented to the [facility] ER today with a complaint of exertional chest pain that began today. She has no cardiac risk factors. No past medical history. She is a physically active, physically fit female. 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 She reports that she was running on the treadmill this morning and experienced exertional chest pressure while doing interval training. She has never experienced this type of discomfort before while exercising. She notes that she had associated left hand pressure and left scapular pain. The pain was relieved with rest and then recurred with continued running. She took two ASA at home and then reported to the [facility] after the pain failed to resolve. She was instructed to come to the ER. Her resting EKG was normal. POC troponin was minimally elevated at 0.09. She currently denies chest pain, but endorses left scapular pain with palpation. No SOB, diaphoresis, n/v, or presyncope. She denies any family history of early CAD, MI or SCD. Her troponins increased to 10. She subsequently had a cath and a successful circumflex PCI and stenting using expedition drug-eluting stent and diagonal branch PCI and stenting using expedition drug-eluting stent.        She was discharged 04/11/13. She called the office today saying she had a near syncopal spell at home. She felt like she was going to pass out if she didn't lay flat. She was told to come to the office and in the lobby she had another episode. When examined she was very anxious, trembling, concerned there was something wrong with her heart. She is not complaining of back and shoulder pain which was she complained of on admission 04/11/13.     Current Outpatient Prescriptions  Medication Sig Dispense Refill  . Ascorbic Acid (VITAMIN C PO) Take  1 tablet by mouth daily.      Marland Kitchen aspirin EC 325 MG EC tablet Take 1 tablet (325 mg total) by mouth daily.  30 tablet  0  . atorvastatin (LIPITOR) 80 MG tablet Take 1 tablet (80 mg total) by mouth daily at 6 PM.  30 tablet  5  . metoprolol succinate (TOPROL XL) 25 MG 24 hr tablet Take 0.5 tablets (12.5 mg total) by mouth daily.  15 tablet  5  . nitroGLYCERIN (NITROSTAT) 0.4 MG SL tablet Place 1 tablet (0.4 mg total) under the tongue every 5 (five) minutes x 3 doses as needed for chest pain.  25 tablet  2  . prasugrel (EFFIENT) 10 MG TABS Take 1 tablet (10 mg total) by mouth daily.  30 tablet  10  . prasugrel (EFFIENT) 10 MG TABS Take 1 tablet (10 mg total) by mouth daily.  30 tablet  0   No current facility-administered medications for this visit.    No Known Allergies  History   Social History  . Marital Status: Married    Spouse Name: N/A    Number of Children: N/A  . Years of Education: N/A   Occupational History  . Not on file.   Social History Main Topics  . Smoking status: Never Smoker   . Smokeless tobacco: Never Used  . Alcohol Use: No  . Drug Use: No  . Sexually Active: Yes    Birth Control/ Protection: None   Other Topics Concern  . Not on file  Social History Narrative  . No narrative on file     Review of Systems: General: negative for chills, fever, night sweats or weight changes.  Cardiovascular: negative for chest pain, dyspnea on exertion, edema, orthopnea, palpitations, paroxysmal nocturnal dyspnea or shortness of breath Dermatological: negative for rash Respiratory: negative for cough or wheezing Urologic: negative for hematuria Abdominal: negative for nausea, vomiting, diarrhea, bright red blood per rectum, melena, or hematemesis Neurologic: negative for visual changes, syncope, or dizziness All other systems reviewed and are otherwise negative except as noted above.    Blood pressure 152/80, pulse 98, last menstrual period 03/21/2013.  General  appearance: alert, cooperative, mild distress and anxious, near tears Lungs: clear to auscultation bilaterally Heart: regular rate and rhythm, S1, S2 normal, no murmur, click, rub or gallop  EKG  EKG: normal EKG, normal sinus rhythm. TWI  V5-V6  ASSESSMENT AND PLAN:   Near syncope Seen as an add on in the office 04/14/13 after two episodes of pre syncope. We suspect this is vaso-vagal  Post-traumatic stress reaction Severe anxiety, vagal reaction after NSTEMI, PCI in a pt who had no medical issues and was a competitive runner.  NSTEMI - S/P PCI and stenting x 2 of Circumflex and Diagonal Branch w/ DES 04/12/13 No further anginal symptoms (back pain, lt arm pain)   PLAN  The pt was seen by Dr [name] and myself. We have instructed her to hydrate liberally. She knows to lay down if she feels like she is going to pass out. We have added Xanax prn, and started Effexor.   Robin Tapia KPA-C 04/14/2013 2:54 PM

## 2013-04-14 NOTE — Assessment & Plan Note (Signed)
Seen as an add on in the office 04/14/13 after two episodes of pre syncope

## 2013-04-19 ENCOUNTER — Telehealth (HOSPITAL_COMMUNITY): Payer: Self-pay | Admitting: Cardiology

## 2013-04-19 NOTE — Telephone Encounter (Signed)
Returned call.  Pt stated she has been having a HA constantly.  Stated it is low grade and c/o "a little tingling" on the top of her head.  Stated she has had the HA since being discharged from the hospital and thought it was r/t caffeine withdrawals, but it has been >1wk now.  Also c/o her vision getting dark when she stands up too fast and it takes a few seconds to get back to normal.  Stated she is not having a vagal response or sweats.  Thinks her vision may be r/t the tension HA in the front of her HA.  HA 3/10 on pain scale.  Denied CP, SOB, back pain or L arm pain.   Pt informed RN will discuss with NP for further instructions.  Pt verbalized understanding and agreed w/ plan.  Message forwarded to L. Annie Paras, NP for further instructions.

## 2013-04-19 NOTE — Telephone Encounter (Signed)
None of her meds should be causing headache.  She should come in and see [facility] tomorrow needs orthostatic BP checks.  Her BP may just be lower than she can tolerate.

## 2013-04-19 NOTE — Telephone Encounter (Signed)
Pt has some questions about side effects of some medications that she is taking.

## 2013-04-19 NOTE — Telephone Encounter (Signed)
Returned call. Left message to call back.

## 2013-04-19 NOTE — Telephone Encounter (Signed)
Pt returned call and informed per instructions by MD/PA.  Pt verbalized understanding and agreed w/ plan.  Pt will come in tomorrow morning at 8am.  Appt confirmed w/ scheduler.

## 2013-04-20 ENCOUNTER — Ambulatory Visit (INDEPENDENT_AMBULATORY_CARE_PROVIDER_SITE_OTHER): Payer: Managed Care, Other (non HMO) | Admitting: Cardiology

## 2013-04-20 ENCOUNTER — Encounter: Payer: Self-pay | Admitting: Cardiology

## 2013-04-20 VITALS — BP 100/70 | HR 95 | Ht 65.0 in | Wt 134.9 lb

## 2013-04-20 DIAGNOSIS — I214 Non-ST elevation (NSTEMI) myocardial infarction: Secondary | ICD-10-CM

## 2013-04-20 DIAGNOSIS — R55 Syncope and collapse: Secondary | ICD-10-CM

## 2013-04-20 DIAGNOSIS — F4310 Post-traumatic stress disorder, unspecified: Secondary | ICD-10-CM

## 2013-04-20 MED ORDER — ROSUVASTATIN CALCIUM 10 MG PO TABS: 10.0000 mg | ORAL_TABLET | Freq: Every day | ORAL | Status: DC

## 2013-04-20 NOTE — Assessment & Plan Note (Signed)
Still very anxious, multiple somatic complaints

## 2013-04-20 NOTE — Assessment & Plan Note (Signed)
No angina 

## 2013-04-20 NOTE — Patient Instructions (Signed)
Stop Lipitor. Take Crestor 10mg  daily. Hydrate liberally.

## 2013-04-20 NOTE — Progress Notes (Signed)
04/20/2013 Robin Tapia   07-20-1966  [id number]  Primary Physicia No PCP Per Patient Primary Cardiologist: Dr [name]  HPI:  The patient is a 47 y/o female, that presented to the [facility] ER today with a complaint of exertional chest pain that began today. She has no cardiac risk factors. No past medical history. She is a physically active, physically fit female. 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 She was admitted 04/12/13 after she developed chest and back pain while running on the treadmill. She ruled in for an MI. She subsequently had a cath and a successful circumflex PCI and stenting using expedition drug-eluting stent and diagonal branch PCI and stenting using expedition drug-eluting stent.          We just saw her in the office last week, she was having near syncope and severe anxiety. We reassured her and instructed her to hydrate liberally. We added Xanax and Effexor. She is here today because of complaints of a headache which she says she has had since discharge. She admits to drinking 40oz of diet [facility] daily prior to her MI. She is also having abdominal cramping after taking Lipitor.     Current Outpatient Prescriptions  Medication Sig Dispense Refill  . ALPRAZolam (XANAX) 0.25 MG tablet Take 1 tablet (0.25 mg total) by mouth 3 (three) times daily as needed for anxiety.  30 tablet  0  . aspirin EC 325 MG EC tablet Take 1 tablet (325 mg total) by mouth daily.  30 tablet  0  . atorvastatin (LIPITOR) 80 MG tablet Take 1 tablet (80 mg total) by mouth daily at 6 PM.  30 tablet  5  . nitroGLYCERIN (NITROSTAT) 0.4 MG SL tablet Place 1 tablet (0.4 mg total) under the tongue every 5 (five) minutes x 3 doses as needed for chest pain.  25 tablet  2  . prasugrel (EFFIENT) 10 MG TABS Take 1 tablet (10 mg total) by mouth daily.  30 tablet  10  . venlafaxine XR (EFFEXOR XR) 75 MG 24 hr capsule Take 1 capsule (75 mg total) by mouth daily.  30 capsule  5  . metoprolol succinate  (TOPROL XL) 25 MG 24 hr tablet Take 0.5 tablets (12.5 mg total) by mouth daily.  15 tablet  5   No current facility-administered medications for this visit.    No Known Allergies  History   Social History  . Marital Status: Married    Spouse Name: N/A    Number of Children: N/A  . Years of Education: N/A   Occupational History  . Not on file.   Social History Main Topics  . Smoking status: Never Smoker   . Smokeless tobacco: Never Used  . Alcohol Use: No  . Drug Use: No  . Sexually Active: Yes    Birth Control/ Protection: None   Other Topics Concern  . Not on file   Social History Narrative  . No narrative on file     Review of Systems: General: negative for chills, fever, night sweats or weight changes.  Cardiovascular: negative for chest pain, dyspnea on exertion, edema, orthopnea, palpitations, paroxysmal nocturnal dyspnea or shortness of breath Dermatological: negative for rash Respiratory: negative for cough or wheezing Urologic: negative for hematuria Abdominal: negative for nausea, vomiting, diarrhea, bright red blood per rectum, melena, or hematemesis Neurologic: negative for visual changes, syncope, or dizziness All other systems reviewed and are otherwise negative except as noted above.    Blood  pressure 100/70, pulse 95, height 5\' 5"  (1.651 m), weight 134 lb 14.4 oz (61.19 kg), last menstrual period 03/21/2013.  General appearance: alert, cooperative and no distress Lungs: clear to auscultation bilaterally Heart: regular rate and rhythm  EKG  EKG: normal EKG, normal sinus rhythm, unchanged from previous tracings.  ASSESSMENT AND PLAN:   Post-traumatic stress reaction Still very anxious, multiple somatic complaints  Near syncope Mild orthostasis  NSTEMI - S/P PCI and stenting x 2 of Circumflex and Diagonal Branch w/ DES 04/12/13 No angina    PLAN  I again reassured her that she was doing well. She was discharge on a high dose of Lipitor-80mg   daily. Her LDL was 107. I gave her samples of Crestor 10mg  and she will let us know if she has any improvement in her symptoms. She is to see Dr [name] in July. I again reinforced the importance of Hydration. She is not taking Toprol secondary to low HR.   Violet Cart KPA-C 04/20/2013 8:55 AM

## 2013-04-20 NOTE — Assessment & Plan Note (Signed)
Mild orthostasis

## 2013-05-01 ENCOUNTER — Ambulatory Visit (INDEPENDENT_AMBULATORY_CARE_PROVIDER_SITE_OTHER): Payer: Managed Care, Other (non HMO) | Admitting: Cardiovascular Disease

## 2013-05-01 ENCOUNTER — Telehealth: Payer: Self-pay | Admitting: Cardiology

## 2013-05-01 VITALS — BP 110/64 | HR 70 | Ht 65.0 in | Wt 126.0 lb

## 2013-05-01 DIAGNOSIS — I214 Non-ST elevation (NSTEMI) myocardial infarction: Secondary | ICD-10-CM

## 2013-05-01 DIAGNOSIS — I2510 Atherosclerotic heart disease of native coronary artery without angina pectoris: Secondary | ICD-10-CM

## 2013-05-01 DIAGNOSIS — E785 Hyperlipidemia, unspecified: Secondary | ICD-10-CM

## 2013-05-01 DIAGNOSIS — R0789 Other chest pain: Secondary | ICD-10-CM

## 2013-05-01 DIAGNOSIS — F4310 Post-traumatic stress disorder, unspecified: Secondary | ICD-10-CM

## 2013-05-01 NOTE — Telephone Encounter (Signed)
Returned call.  Pt stated she is still having some weird symptoms that she needs to let us know about.  C/o blacking out when she stands, tightness in her legs, very tired, low grade HA, abdominal tightness.  Stated she had a real sharp pain in her head that made her scream.  Also stated she is under 132lbs and is concerned about the Effient.  Also stated she thinks she is overmedicated and had numbness in her feet.  Pt informed RN will discuss with MD/PA for further instructions.  Pt verbalized understanding and agreed w/ plan.

## 2013-05-01 NOTE — Telephone Encounter (Signed)
Still having problems-tightness in chest,back and legs! Feet tingling and numbness-Mild headache,darkness when she stands up!Last week had asevere pain in her head-happen twice-so severe it made her scream! Real tired all the time-Needs your advice,weighs 132lb-might need to change her medicine!

## 2013-05-01 NOTE — Patient Instructions (Addendum)
Your physician has recommended you make the following change in your medication: decrease crestor to 5 mg. ( 1/2 of the 10 mg tablet)  Cut Asprin to 81 mg daily.  Your physician recommends that you return for lab work  Before your next visit.  Your physician recommends that you schedule a follow-up appointment in: July with Dr. [name]

## 2013-05-01 NOTE — Telephone Encounter (Signed)
Dr. Rennis Golden notified and advised pt come in for evaluation as it is difficult to manage over the phone.    Returned call and informed pt per instructions by MD/PA.  Pt verbalized understanding and agreed w/ plan.  Appt scheduled today w/ Dr. Tresa Endo for evaluation.  Pt advised to have someone bring her to the appt r/t near syncope events.  Pt stated it only happens when she changes positions.

## 2013-05-02 LAB — COMPREHENSIVE METABOLIC PANEL
ALT: 26 U/L (ref 0–35)
Alkaline Phosphatase: 49 U/L (ref 39–117)
CO2: 25 mEq/L (ref 19–32)
Creat: 0.94 mg/dL (ref 0.50–1.10)
Sodium: 140 mEq/L (ref 135–145)
Total Bilirubin: 0.8 mg/dL (ref 0.3–1.2)

## 2013-05-02 LAB — CBC
HCT: 41.4 % (ref 36.0–46.0)
MCH: 30 pg (ref 26.0–34.0)
MCV: 86.3 fL (ref 78.0–100.0)
RBC: 4.8 MIL/uL (ref 3.87–5.11)
RDW: 13.2 % (ref 11.5–15.5)
WBC: 4.3 10*3/uL (ref 4.0–10.5)

## 2013-05-04 LAB — NMR LIPOPROFILE WITH LIPIDS
HDL Particle Number: 37.2 umol/L (ref >=30.5)
HDL Size: 9.4 nm (ref >=9.2)
HDL-C: 53 mg/dL (ref >=40)
LDL (calc): 58 mg/dL (ref <100)
LDL Particle Number: 830 nmol/L (ref <1000)
LDL Size: 20.3 nm — ABNORMAL LOW (ref >20.5)
LP-IR Score: 33 (ref <=45)
Small LDL Particle Number: 382 nmol/L (ref <=527)

## 2013-05-08 ENCOUNTER — Encounter: Payer: Self-pay | Admitting: Cardiovascular Disease

## 2013-05-08 ENCOUNTER — Encounter: Payer: Self-pay | Admitting: Cardiology

## 2013-05-08 DIAGNOSIS — E785 Hyperlipidemia, unspecified: Secondary | ICD-10-CM | POA: Insufficient documentation

## 2013-05-08 DIAGNOSIS — R0789 Other chest pain: Secondary | ICD-10-CM | POA: Insufficient documentation

## 2013-05-08 NOTE — Progress Notes (Addendum)
Patient ID: Robin Tapia, female   DOB: July 02, 1966, 47 y.o.   MRN: [id number]     HPI: Robin Tapia, is a 47 y.o. female who is seen in the office today as an add-on with complaints of recurrent chest scar for as well as intermittent headaches.  Ms. Leidy very at clinic female who has been a competitive runner for almost 3 decades. A 04/12/2013 she experienced exertional chest pressure while doing intense interval training. She did have left scapular pain. Ultimately was hospitalized and had a positive troponin at 10. Cardiac catheterization done the following day revealed a nondominant left circumflex coronary artery but with a 95% mid AV groove stenosis. In addition the first diagonal branch of her LAD was occluded and the LAD itself had only mild irregularities. Faint left-to-right collaterals to the diagonal. Her right coronary was dominant and free of significant disease. She underwent successful circumflex and diagonal stenting utilizing drug-eluting Xience xpedition Stents placed by Dr. Allyson Sabal she apparently has not had any caffeine since June 3. She admits to a 10 pound weight loss. On 04/20/2013 she last saw Cecilio Asper, [facility] in the office for followup evaluation and apparently had seen him the week before for presyncope and severe anxiety and apparently he added Xanax and Effexor to her regimen. When he saw her on 04/20/2013 she was having some abdominal cramps after taking Lipitor apparently was switched to Crestor 10 mg. He reinforced hydration. She was not taking Toprol secondary to low heart rate.  She presents to the office today as an add-on again complaining of some vague aches in her chest and back also short-lived headaches. Prior to her intervention, she had been drinking a fair amount of caffeine but has not had any since. She denies any further presyncope.  Past Medical History  Diagnosis Date  . Pneumonia ~ 2002    'walking" (04/11/2013)  . Piriformis muscle pain       LLE (04/11/2013)  . Presence of bare metal stent in left circumflex coronary artery & Diagonal 2 branch of LAD 04/12/2013  . NSTEMI - S/P PCI and stenting x 2 of Circumflex and Diagonal Branch w/ DES 04/12/13 04/13/2013    Past Surgical History  Procedure Laterality Date  . Finger fracture surgery Left ~2008    "ring finger" (04/11/2013)  . Coronary angioplasty with stent placement  03/12/13    Xience Exp. 3.47mm x 23 mm (post-dil to 3.104mm) - OM1; Xience Exp. 2.5 mm x 15 mm - Diag 2    No Known Allergies  Current Outpatient Prescriptions  Medication Sig Dispense Refill  . ALPRAZolam (XANAX) 0.25 MG tablet Take 1 tablet (0.25 mg total) by mouth 3 (three) times daily as needed for anxiety.  30 tablet  0  . aspirin EC 325 MG EC tablet Take 1 tablet (325 mg total) by mouth daily.  30 tablet  0  . metoprolol succinate (TOPROL XL) 25 MG 24 hr tablet Take 0.5 tablets (12.5 mg total) by mouth daily.  15 tablet  5  . nitroGLYCERIN (NITROSTAT) 0.4 MG SL tablet Place 1 tablet (0.4 mg total) under the tongue every 5 (five) minutes x 3 doses as needed for chest pain.  25 tablet  2  . prasugrel (EFFIENT) 10 MG TABS Take 1 tablet (10 mg total) by mouth daily.  30 tablet  10  . rosuvastatin (CRESTOR) 10 MG tablet Take 1 tablet (10 mg total) by mouth daily.  1 tablet  0  . venlafaxine XR (  EFFEXOR XR) 75 MG 24 hr capsule Take 1 capsule (75 mg total) by mouth daily.  30 capsule  5   No current facility-administered medications for this visit.    Socially she is in her second marriage. She has 4 children ages 45 and 56 from her first marriage ages 52 and 25 for her second marriage. Has no tobacco history but she admits to a lifelong commitment to exercise and running competitively.  ROS is negative for fevers, chills or night sweats.  She denies any palpitations. She denies presyncope or syncope. She admits to a 10 pound weight loss. She denies wheezing. She denies bleeding. She denies myalgias she states that she  just does not feel the same. Other system review is negative.  PE BP 110/64  Pulse 70  Ht 5\' 5"  (1.651 m)  Wt 126 lb (57.153 kg)  BMI 20.97 kg/m2  LMP 03/21/2013  Repeat BP by me supine 110/64, standing 110/62. General: Alert, oriented, no distress.  Skin: normal turgor, no rashes HEENT: Normocephalic, atraumatic. Pupils round and reactive; sclera anicteric;no lid lag.  Nose without nasal septal hypertrophy Mouth/Parynx benign; Mallinpatti scale 2 Neck: No JVD, no carotid briuts Lungs: clear to ausculatation and percussion; no wheezing or rales Heart: RRR, s1 s2 normal; no gallop Abdomen: soft, nontender; no hepatosplenomehaly, BS+; abdominal aorta nontender and not dilated by palpation. Pulses 2+ Extremities: no clubbing cyanosis or edema, Homan's sign negative  Neurologic: grossly nonfocal  ECG: Sinus rhythm at 70 beats per minute.. 152 ms, QTc interval 466 ms  LABS:  BMET    Component Value Date/Time   NA 140 05/02/2013 0915   K 4.5 05/02/2013 0915   CL 105 05/02/2013 0915   CO2 25 05/02/2013 0915   GLUCOSE 74 05/02/2013 0915   BUN 14 05/02/2013 0915   CREATININE 0.94 05/02/2013 0915   CREATININE 0.86 04/13/2013 0345   CALCIUM 9.8 05/02/2013 0915   GFRNONAA 79* 04/13/2013 0345   GFRAA >90 04/13/2013 0345     Hepatic Function Panel     Component Value Date/Time   PROT 7.3 05/02/2013 0915   ALBUMIN 4.6 05/02/2013 0915   AST 28 05/02/2013 0915   ALT 26 05/02/2013 0915   ALKPHOS 49 05/02/2013 0915   BILITOT 0.8 05/02/2013 0915     CBC    Component Value Date/Time   WBC 4.3 05/02/2013 0915   RBC 4.80 05/02/2013 0915   HGB 14.4 05/02/2013 0915   HCT 41.4 05/02/2013 0915   PLT 279 05/02/2013 0915   MCV 86.3 05/02/2013 0915   MCH 30.0 05/02/2013 0915   MCHC 34.8 05/02/2013 0915   RDW 13.2 05/02/2013 0915     BNP No results found for this basename: probnp    Lipid Panel     Component Value Date/Time   CHOL 196 04/12/2013 0520   TRIG 64 05/02/2013 0915   TRIG 151* 04/12/2013  0520   HDL 59 04/12/2013 0520   CHOLHDL 3.3 04/12/2013 0520   VLDL 30 04/12/2013 0520   LDLCALC 58 05/02/2013 0915   LDLCALC 107* 04/12/2013 0520     RADIOLOGY: Dg Chest Port 1 View  04/11/2013   *RADIOLOGY REPORT*  Clinical Data: Chest pain.  Short of breath.  PORTABLE CHEST - 1 VIEW  Comparison: None.  Findings:  Cardiopericardial silhouette within normal limits. Mediastinal contours normal. Trachea midline.  No airspace disease or effusion. Monitoring leads are projected over the chest. No pneumothorax.  IMPRESSION: No active cardiopulmonary disease.   Original Report  Authenticated By: Andreas Newport, M.D.      ASSESSMENT AND PLAN: Ms. Mericle is now 3 weeks status post her non-ST segment elevation myocardial infarction which most likely was due to an occluded diagonal vessel and at the time was also noted to have high grade AV groove circumflex disease. She underwent successful stenting. Previously, she denied any exertional chest pain discomfort until the day that she was doing her high-intensity workout. Her LDL in the  hospital  was 161. I am recommending that she undergo an NMR Liposcience profile to further evaluate her LDL particle number. It is possible that some of her headaches may be contributed to caffeine withdrawal. She is not orthostatic on exam. Radiographically there was no mention of any kind abnormal cardiac silhouette or aortic profile arguing against aortic root dilatation to cause her back discomfort. I am recommending that  she decrease her aspirin to 81 mg. She is on Effient at 10 mg.  I will try reducing her Crestor 5 mg to see if this improves some of her symptoms.  I am recommending that she ultimately undergo an exercise Myoview scan. However, she does have a appointment already scheduled to see Dr. [name] on July 1 and have recommended that she keep this appointment for further evaluation.      Lennette Bihari, MD, [facility]  05/08/2013 10:09 AM

## 2013-05-09 ENCOUNTER — Ambulatory Visit (INDEPENDENT_AMBULATORY_CARE_PROVIDER_SITE_OTHER): Payer: Managed Care, Other (non HMO) | Admitting: Cardiology

## 2013-05-09 ENCOUNTER — Encounter: Payer: Self-pay | Admitting: Cardiology

## 2013-05-09 VITALS — BP 104/68 | HR 80 | Ht 65.0 in | Wt 133.0 lb

## 2013-05-09 DIAGNOSIS — F4310 Post-traumatic stress disorder, unspecified: Secondary | ICD-10-CM

## 2013-05-09 DIAGNOSIS — I2510 Atherosclerotic heart disease of native coronary artery without angina pectoris: Secondary | ICD-10-CM

## 2013-05-09 DIAGNOSIS — R0789 Other chest pain: Secondary | ICD-10-CM

## 2013-05-09 DIAGNOSIS — R55 Syncope and collapse: Secondary | ICD-10-CM

## 2013-05-09 DIAGNOSIS — E785 Hyperlipidemia, unspecified: Secondary | ICD-10-CM

## 2013-05-09 MED ORDER — ROSUVASTATIN CALCIUM 10 MG PO TABS: ORAL_TABLET | ORAL | Status: DC

## 2013-05-09 MED ORDER — ALPRAZOLAM 0.25 MG PO TABS: 0.2500 mg | ORAL_TABLET | Freq: Three times a day (TID) | ORAL | Status: DC | PRN

## 2013-05-09 NOTE — Progress Notes (Signed)
Patient ID: Robin Tapia, female   DOB: [birth year], 47 y.o.   MRN: [id number]  Clinic Note: HPI: Robin Tapia is a 47 y.o. 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  She presents today for f/u of NSTEMI & PTSD, and near-syncope the symptoms.. She was third of June with a non-ST elevation MI. She went for cardiac catheterization and was found to have a severe circumflex/OM lesion as well as an occluded large diagonal branch. The OM lesion was fixed first and then difficult course the procedure and the diagonal branch became more apparent as a large vessel, and therefore was intervened upon.  She initially did very well post catheterization and was want to go home very shortly after. However when she went home I didn't at all at home after dropping her father-in-law off at their house. On the drive the rest the way home she had a wet mount to be a panic attack with near syncope. She came in and spent about 45 minutes the clinic on that time. We ensure that she was adequate hydrated. Aortic when necessary Xanax and start Effexor. She then came back in to see Dr. Tresa Endo a June 23 with again symptoms of chest discomfort and intermittent headaches. She has a 10 pound weight loss since her discharge. She also noted that she essentially stopped "cold [place] "caffeine. She had any episodes of presyncope at that time.   Interval History: Since she last saw Dr. Tresa Endo, she started noticing that she felt like a zombie when she started the Effexor. She  read up on the potential side effects of Effexor and felt that maybe what it was. She is very concerned about myalgias and other muscular effects of statins. She also is very concerned about at the end with her current weight being right 130 pounds. Thankfully her aspirin was reduced to 81 mg at last visit. Dr. Tresa Endo also reduced her Crestor to 5 mg daily.  She has intermittent mild, vague  chest discomfort symptoms. But no heaviness or pressure. Unfortunately she's not really sure what her true anginal symptom was. No further episodes of panic attacks or syncope/near syncope -- she is also feeling less orthostatic, and is ensuring that she is adequate hydrated.. She denies any shortness of breath with rest or exertion and she has now started back into some training. No PND, orthopnea or edema. No palpitations, light headedness, TIA/amaurosis fugax symptoms. No claudication. She's had some mild bruising but nothing significant. No melena, hematochezia hematuria.   Past Medical History  Diagnosis Date  . CAD (coronary artery disease), native coronary artery 04/2013    - 100% D2, ~90% Cx-OM1; EF 45-50%  . NSTEMI - S/P PCI and stenting x 2 of Circumflex and Diagonal Branch w/ DES 04/12/13 04/12/2013    Pathophysiology consistent with STEMI with occluded diagonal branch he  . Presence of drug coated stent in a Diagonal branch of the LAD coronary artery 04/12/2013    Xence EXpd DES 2.25 mm x 15 mm - post-dil to 2.4 mm  . Presence of drug coated stent in left circumflex coronary artery 04/12/2013    Xience EXpd DES 3.0 mm x 23 mm - post-dil to 3.25 mm  . Piriformis muscle pain     LLE  . History of pneumonia ~2002    "walking PNA  . Dyslipidemia, goal LDL below 70     Reduced to 5 mg Crestor  Prior Cardiac Evaluation and Past Surgical History: Past Surgical History  Procedure Laterality Date  . Finger fracture surgery Left ~2008    "ring finger" (04/11/2013)  . Coronary angioplasty with stent placement  03/12/13    Xience Exp. 3.39mm x 23 mm (post-dil to 3.20mm) - OM1; Xience Exp. 2.25 mm x 15 mm - Diag 2 (post-dil 2.4 mm)    No Known Allergies  Current Outpatient Prescriptions  Medication Sig Dispense Refill  . ALPRAZolam (XANAX) 0.25 MG tablet Take 1 tablet (0.25 mg total) by mouth 3 (three) times daily as needed for anxiety.  90 tablet  2  . aspirin EC 81 MG tablet Take 81 mg by  mouth daily.      . nitroGLYCERIN (NITROSTAT) 0.4 MG SL tablet Place 1 tablet (0.4 mg total) under the tongue every 5 (five) minutes x 3 doses as needed for chest pain.  25 tablet  2  . prasugrel (EFFIENT) 10 MG TABS Take 1 tablet (10 mg total) by mouth daily.  30 tablet  10  . rosuvastatin (CRESTOR) 10 MG tablet Take 1 tab daily  30 tablet  6   No current facility-administered medications for this visit.    History   Social History  . Marital Status: Married    Spouse Name: N/A    Number of Children: N/A  . Years of Education: N/A   Occupational History  . Not on file.   Social History Main Topics  . Smoking status: Never Smoker   . Smokeless tobacco: Never Used  . Alcohol Use: No  . Drug Use: No  . Sexually Active: Yes    Birth Control/ Protection: None   Other Topics Concern  . Not on file   Social History Narrative   Socially she is in her second marriage. She has 4 children ages 16 and 41 from her first marriage ages 41 and 79 for her second marriage. Has no tobacco history but she admits to a lifelong commitment to exercise and running competitively. She is eagerly and has been getting back into full training, and competitions.    ROS: A comprehensive Review of Systems - Negative except pertinence and as noted above.  PHYSICAL EXAM BP 104/68  Pulse 80  Ht 5\' 5"  (1.651 m)  Wt 133 lb (60.328 kg)  BMI 22.13 kg/m2  LMP 03/21/2013 General appearance: alert, cooperative, appears stated age, no distress and Healthy-appearing, less anxious than previous. Well-nourished, well-groomed. She does look a bit lighter than last I saw her Neck: no adenopathy, no carotid bruit, no JVD, supple, symmetrical, trachea midline and thyroid not enlarged, symmetric, no tenderness/mass/nodules Lungs: clear to auscultation bilaterally, normal percussion bilaterally and Nonlabored, good air movement. Heart: regular rate and rhythm, S1, S2 normal, no murmur, click, rub or gallop and normal  apical impulse Abdomen: soft, non-tender; bowel sounds normal; no masses,  no organomegaly Extremities: extremities normal, atraumatic, no cyanosis or edema, no edema, redness or tenderness in the calves or thighs and no ulcers, gangrene or trophic changes Pulses: 2+ and symmetric Neurologic: Grossly normal HEENT: Bryant/AT, EOMI, MMM, anicteric sclera  JYN:[id number] today: No  Recent Labs: On June 24  Sodium 140, potassium 4.5, chloride 105, bicarbonate 25, BUN/creatinine 14/0.94, glucose 74. LFTs normal.  LDL 58  CBC with a white blood count of 4.3, H&H 14.4/41.4, platelets 279.  TSH 1.68  ASSESSMENT / PLAN: Finally stabilizing following her recent MI and multivessel PCI. I think her PTSD phase is over, but she still needs the  when necessary Xanax for now. She didn't seem to do very well on the Effexor, and says she is doing better I think were fine without it.  CAD (coronary artery disease), native coronary artery - NSTEMI - S/P PCI x 2 of Circumflex and Diag 2 w/ DES 04/12/13 At 108 Therapy well for symptomatic standpoint. She is however very concerned about getting back into her full level of activity. She is on dual antiplatelet therapy which she'll need to be on for a minimum of one year. She is on low-dose statin but does not have any blood pressure room for beta blocker or ACE inhibitor. Thankfully her ejection fraction seemed to be stable.  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 She never really had too much the way of symptoms at that time her heart attack. I think it's relatively important to check for any residual ischemia, . She is specifically asked to be evaluated prior to getting into any rigorous activity if she can have a stress test. This makes a lot of sense. I don't think it would be level of exertion she is able to do, she'll get your heart rate up enough on the  treadmill to make it worthwhile. I will do a LexiScan Myoview just to confirm no evidence of ischemia which would give her clear conscious for proceeding with her high level of intensity workouts and competitions. We'll need to wait at least 3 more weeks for a full cardiac recovery prior to proceeding with the stress test.  Near syncope -- with orthostatic hypotension She seems to be much better from the standpoint. Adequately hydrated and eating better. No further episodes. She not on a beta blocker or ACE inhibitor, based on her baseline borderline blood pressures.  Dyslipidemia, goal LDL below 70 Her LDL is pre-well at goal. However despite this, she still had two-vessel coronary disease. I would like her to be on a statin. She does make some sense with the concern for mild does and myopathies but her level of exertion. My recommendation would be to hold her statin for almost a month before a major race. She can probably back off to every other day statin if she does note some myalgias with exertion. We'll recheck her labs when I see her back in 4 months.  Post-traumatic stress reaction This does not seem to be major problem now. She does have when necessary Xanax however.  Refill Xanax.   Orders Placed This Encounter  Procedures  . Myocardial Perfusion Imaging    Standing Status: Future     Number of Occurrences:      Standing Expiration Date: 05/09/2014    Scheduling Instructions:     In ~2-3 weeks    Order Specific Question:  Where should this test be performed    Answer:  [facility]    Order Specific Question:  Type of stress    Answer:  Lexiscan    Order Specific Question:  Patient weight in lbs    Answer:  133   Followup: 4 months  HARDING,DAVID W, M.D., M.S. [facility] [address] [place], [place]  [address]  [phone] Pager # [phone]

## 2013-05-09 NOTE — Patient Instructions (Addendum)
Your physician has recommended you make the following change in your medication you  may hold crestor 4 weeks prior to a race.   Your physician has requested that you have a lexiscan myoview. For further information please visit [email or web address] Please follow instruction sheet, as given.    Your physician wants you to follow-up in 4 months.  You will receive a reminder letter in the mail two months in advance. If you don't receive a letter, please call our office to schedule the follow-up appointment.

## 2013-05-14 ENCOUNTER — Encounter: Payer: Self-pay | Admitting: Cardiology

## 2013-05-14 NOTE — Assessment & Plan Note (Addendum)
She seems to be much better from the standpoint. Adequately hydrated and eating better. No further episodes. She not on a beta blocker or ACE inhibitor, based on her baseline borderline blood pressures.

## 2013-05-14 NOTE — Assessment & Plan Note (Addendum)
Her LDL is pre-well at goal. However despite this, she still had two-vessel coronary disease. I would like her to be on a statin. She does make some sense with the concern for mild does and myopathies but her level of exertion. My recommendation would be to hold her statin for almost a month before a major race. She can probably back off to every other day statin if she does note some myalgias with exertion. We'll recheck her labs when I see her back in 4 months.

## 2013-05-14 NOTE — Assessment & Plan Note (Signed)
This does not seem to be major problem now. She does have when necessary Xanax however.

## 2013-05-14 NOTE — Assessment & Plan Note (Addendum)
At 108 Therapy well for symptomatic standpoint. She is however very concerned about getting back into her full level of activity. She is on dual antiplatelet therapy which she'll need to be on for a minimum of one year. She is on low-dose statin but does not have any blood pressure room for beta blocker or ACE inhibitor. Thankfully her ejection fraction seemed to be stable.  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 She never really had too much the way of symptoms at that time her heart attack. I think it's relatively important to check for any residual ischemia, . She is specifically asked to be evaluated prior to getting into any rigorous activity if she can have a stress test. This makes a lot of sense. I don't think it would be level of exertion she is able to do, she'll get your heart rate up enough on the treadmill to make it worthwhile. I will do a LexiScan Myoview just to confirm no evidence of ischemia which would give her clear conscious for proceeding with her high level of intensity workouts and competitions. We'll need to wait at least 3 more weeks for a full cardiac recovery prior to proceeding with the stress test.

## 2013-05-24 ENCOUNTER — Encounter: Payer: Self-pay | Admitting: *Deleted

## 2013-05-25 ENCOUNTER — Encounter: Payer: Self-pay | Admitting: *Deleted

## 2013-05-25 NOTE — Progress Notes (Signed)
Quick Note:    Letter sent to patient.  ______

## 2013-05-30 ENCOUNTER — Ambulatory Visit (HOSPITAL_COMMUNITY)
Admission: RE | Admit: 2013-05-30 | Discharge: 2013-05-30 | Disposition: A | Discharge status: Home / Self Care | Payer: Managed Care, Other (non HMO) | Source: Ambulatory Visit | Attending: Cardiology | Admitting: Cardiology

## 2013-05-30 DIAGNOSIS — R0789 Other chest pain: Secondary | ICD-10-CM

## 2013-05-30 DIAGNOSIS — F4310 Post-traumatic stress disorder, unspecified: Secondary | ICD-10-CM

## 2013-05-30 DIAGNOSIS — I2510 Atherosclerotic heart disease of native coronary artery without angina pectoris: Secondary | ICD-10-CM

## 2013-05-30 HISTORY — PX: NM MYOVIEW LTD: HXRAD82

## 2013-05-30 NOTE — Procedures (Addendum)
[facility] [address] [place] [address] [phone]  Cardiology Nuclear Med Study  Robin Tapia is a 47 y.o. female     MRN : [id number]     DOB: 1966-08-22  Procedure Date: 05/30/2013  Nuclear Med Background Indication for Stress Test:  Stent Patency History:  MI 6/14, Stent 6/14 Cardiac Risk Factors: Lipids  Symptoms:  Chest Pain   Nuclear Pre-Procedure Caffeine/Decaff Intake:  7:00pm NPO After: 5:30am   IV Site: R Antecubital  IV 0.9% NS with Angio Cath:  22g  Chest Size (in):  n/a IV Started by: Emmit Pomfret, RN  Height: 5\' 5"  (1.651 m)  Cup Size: C  BMI:  Body mass index is 22.13 kg/(m^2). Weight:  133 lb (60.328 kg)   Tech Comments:  n/a    Nuclear Med Study 1 or 2 day study: 1 day  Stress Test Type:  Lexiscan  Order Authorizing Provider:  Bryan Lemma, MD   Resting Radionuclide: Technetium 64m Sestamibi  Resting Radionuclide Dose: 10.1 mCi   Stress Radionuclide:  Technetium 79m Sestamibi  Stress Radionuclide Dose: 31.0 mCi           Stress Protocol Rest HR: 66 Stress HR:113  Rest BP: 132/80 Stress BP:157/79  Exercise Time (min): n/a METS: n/a          Dose of Adenosine (mg):  n/a Dose of Lexiscan: 0.4 mg  Dose of Atropine (mg): n/a Dose of Dobutamine: n/a mcg/kg/min (at max HR)  Stress Test Technologist: Ernestene Mention, CCT Nuclear Technologist: Koren Shiver, CNMT   Rest Procedure:  Myocardial perfusion imaging was performed at rest 45 minutes following the intravenous administration of Technetium 91m Sestamibi. Stress Procedure:  The patient received IV Lexiscan 0.4 mg over 15-seconds.  Technetium 35m Sestamibi injected at 30-seconds.  There were no significant changes with Lexiscan.  Quantitative spect images were obtained after a 45 minute delay.  Transient Ischemic Dilatation (Normal <1.22):  1.01 Lung/Heart Ratio (Normal <0.45):  0.27 QGS EDV:  96 ml QGS ESV:  37 ml LV  Ejection Fraction: 61%  Signed by Koren Shiver, CNMT  Rest ECG: NSR with non-specific ST-T wave changes  Stress ECG: No significant change from baseline ECG and No significant ST segment change suggestive of ischemia.  QPS Raw Data Images:  There was significnt sub-diaphragmatic tracer uptake more notable in rest than stress images. Stress Images:  There is mild apical thinning with normal uptake in other regions. The is a possible small, mild intensity anterolateral perfusion defect with mild reversibility. Rest Images:  Normal homogeneous uptake in all areas of the myocardium. Subtraction (SDS):  There is no evidence of scar or significant ischemia.  Impression Exercise Capacity:  Lexiscan with no exercise. BP Response:  Normal blood pressure response. Clinical Symptoms:  There is dyspnea. ECG Impression:  No significant ECG changes with Lexiscan. Comparison with Prior Nuclear Study: No previous nuclear study performed  Overall Impression:  Low risk stress nuclear study with a possible mild anterolateral defect not noted by the computer processing.  .  LV Wall Motion:  NL LV Function; NL Wall Motion   HARDING,DAVID W, MD  05/30/2013 1:27 PM

## 2013-06-02 ENCOUNTER — Telehealth: Payer: Self-pay | Admitting: *Deleted

## 2013-06-02 NOTE — Telephone Encounter (Signed)
Notified  It will fine to run /race . Per Dr [name]., she is 2 months out from procedure, voiced understanding

## 2013-06-02 NOTE — Telephone Encounter (Signed)
Message copied by Tobin Chad on Fri Jun 02, 2013  9:47 AM ------      Message from: [facility], DAVID      Created: Tue May 30, 2013  4:17 PM       Good news - the study looks pretty normal.  There is a hint of a small area that has decreased blood flow -- this correlates well with a small branch of one of your stented arteries that was too small to try to open up.  We actually saw faint flow in it once the stent was placed.  It will probably open back up on its on & should not cause any symptoms.              Otherwise, this looks great.Marland Kitchen            Marykay Lex, MD       ------

## 2013-06-02 NOTE — Telephone Encounter (Signed)
She should be fine.  Almost 2 months out.  Marykay Lex, MD

## 2013-06-02 NOTE — Telephone Encounter (Signed)
Left message to call back, try cell no answer

## 2013-06-02 NOTE — Telephone Encounter (Signed)
Returned your call.

## 2013-06-02 NOTE — Telephone Encounter (Signed)
Spoke to patient.Results given. She voiced understanding. She had a question? Can she continue to race (running). Will defer to [facility]. Will call patient with answer.

## 2013-06-15 ENCOUNTER — Telehealth: Payer: Self-pay | Admitting: Cardiology

## 2013-06-15 MED ORDER — CLOPIDOGREL BISULFATE 75 MG PO TABS: 75.0000 mg | ORAL_TABLET | Freq: Every day | ORAL | Status: DC

## 2013-06-15 NOTE — Telephone Encounter (Signed)
Spoke with pt.  Explained bleeding on antiplatelets is normal.  Per Dr. Elissa Hefty note, will need to continue dual antiplatelets x 1 year.  Suggested ask OB-GYN if there are options to help control bleeding (IUD, etc).  She has an appt with him next month but states she does not want to take any more medications- she would rather bleed than do that.   She has also read that she should not take Effient 10mg  if she weighs less than 132 lb.  She is currently in the 120s and has some concerns about the dose.  Will send to Dr. [name] to review if pt a better candidate for alternate antiplatelet.

## 2013-06-15 NOTE — Telephone Encounter (Signed)
Returned call.  Pt stated she wanted to find out if what she is experiencing with her periods is normal.  Stated the first time she had her period she was on 4 baby ASA and it lasted for 8 days.  Stated it usually lasts 2-3 days.  Pt c/o episode of bleeding after she thought her cycle had stopped.  Stated she went running and after the run, blood was spewing down her legs.  Stated she had to sit on a towel in her car to get home and it was saturated.  Stated she used a tampon and pad and stayed in the bed the rest of the night.  Stated it finally stopped.  Pt stated she had an episode during her last cycle where she had to wear a pad and tampon and it was saturated in 30-40 mins.  Stated next cycle is due in 3 weeks and she wants to know what to do b/c she cannot do this every cycle.  Pt informed pharmacist will be notified for further instructions.  Pt verbalized understanding and agreed w/ plan.  Message forwarded to S. Putt, RPH.

## 2013-06-15 NOTE — Telephone Encounter (Signed)
OK - lets change make sure ASA is only 81 mg daily.  We will convert to Plavix 75 mg daily -- but do not stop Effient until she gets the Plavix Rx filled & ready to take. Simply take Plavix @ same time as she had been taking Effient.    She does not want her stents to shut down.  Rx Plavix 75 mg po daily -- 90 pills with 4 refills  Plavix is less potent than Effient.  I am leery of stopping ASA with Plavix, but after ~8-9 months out from PCI, we could potentially do that.

## 2013-06-15 NOTE — Telephone Encounter (Signed)
Please call-having problems with her period and she is on blood thinner.

## 2013-06-15 NOTE — Telephone Encounter (Signed)
Returned call and informed pt per instructions by MD/PA.  Pt verbalized understanding and agreed w/ plan.  Rx sent to pharmacy.  

## 2013-06-19 ENCOUNTER — Telehealth: Payer: Self-pay | Admitting: Cardiology

## 2013-06-19 MED ORDER — PRASUGREL HCL 10 MG PO TABS: 10.0000 mg | ORAL_TABLET | Freq: Every day | ORAL | Status: DC

## 2013-06-19 NOTE — Telephone Encounter (Signed)
Pt complains of nausea, constipation, bad aftertaste in mouth, joint pain.  She thinks that the symptoms are worse on plavix than effient.  She will switch back to effient.  I advised her to follow up with her GYN MD.

## 2013-06-19 NOTE — Telephone Encounter (Signed)
Patient started on Plavix.  More side effects than Effient.  Wants to swtich back to Effient.

## 2013-06-29 ENCOUNTER — Ambulatory Visit: Payer: Managed Care, Other (non HMO) | Admitting: Family Medicine

## 2013-07-11 ENCOUNTER — Telehealth: Payer: Self-pay | Admitting: Cardiology

## 2013-07-11 ENCOUNTER — Telehealth: Payer: Self-pay | Admitting: Cardiovascular Disease

## 2013-07-11 MED ORDER — PRASUGREL HCL 10 MG PO TABS: 10.0000 mg | ORAL_TABLET | Freq: Every day | ORAL | Status: DC

## 2013-07-11 NOTE — Telephone Encounter (Signed)
Left a message with [facility] that per patient she had to get her medication locally. [facility]. If this is incorrect then someone needs to contact the patient to inform her of what her plan requires.

## 2013-07-11 NOTE — Telephone Encounter (Signed)
Need you to call in a 90 days supply of Effient 10 mg.[phone]-Must have a 90 day supply.

## 2013-07-11 NOTE — Telephone Encounter (Signed)
Patient called to say that she hasn't gotten her effient refilled. She has been trying for a few weeks now. I informed her that our records show that it was sent in on 8/11. She says that the pharmacy never received it. [facility] per patients request.

## 2013-07-11 NOTE — Telephone Encounter (Signed)
Pt would like a call back regarding her Effient.

## 2013-07-11 NOTE — Telephone Encounter (Signed)
Per answering service-Call on Tuesday for refill ob Effient 10 mg.

## 2013-07-12 ENCOUNTER — Telehealth: Payer: Self-pay | Admitting: *Deleted

## 2013-07-12 ENCOUNTER — Telehealth: Payer: Self-pay | Admitting: Cardiology

## 2013-07-12 MED ORDER — PRASUGREL HCL 10 MG PO TABS: 10.0000 mg | ORAL_TABLET | Freq: Every day | ORAL | Status: DC

## 2013-07-12 NOTE — Telephone Encounter (Signed)
Patient telephoned to inform us that [facility] not [facility]. i notified her that I did e-scribe the prescription after our conversation on yesterday. I will call and verbally give the prescription to them.

## 2013-07-12 NOTE — Telephone Encounter (Signed)
Spoke to [employer]. PA # [phone] needs prior auth. Will call insurance. Spoke to patient. She states that she is out of medication and received 5 tablets. Will notify patient of outcome.

## 2013-07-12 NOTE — Telephone Encounter (Signed)
Spoke to patient . Informed her that Gillis Ends [facility]) was working on approving her medication effient  He states that he will contact the patient and she was aware.  Samples left for patient-  To pick 4 bottles and saving card

## 2013-07-12 NOTE — Telephone Encounter (Signed)
Sarah calling in regards to a PA that needs to be done immediately.

## 2013-07-27 ENCOUNTER — Ambulatory Visit (INDEPENDENT_AMBULATORY_CARE_PROVIDER_SITE_OTHER): Payer: Managed Care, Other (non HMO) | Admitting: Family Medicine

## 2013-07-27 ENCOUNTER — Encounter: Payer: Self-pay | Admitting: Family Medicine

## 2013-07-27 VITALS — BP 104/64 | HR 106 | Temp 97.9°F | Ht 65.0 in | Wt 131.8 lb

## 2013-07-27 DIAGNOSIS — Z23 Encounter for immunization: Secondary | ICD-10-CM

## 2013-07-27 DIAGNOSIS — E785 Hyperlipidemia, unspecified: Secondary | ICD-10-CM

## 2013-07-27 DIAGNOSIS — I2510 Atherosclerotic heart disease of native coronary artery without angina pectoris: Secondary | ICD-10-CM

## 2013-07-27 NOTE — Progress Notes (Signed)
Subjective:    Patient ID: Robin Tapia, female    DOB: 04-24-1966, 47 y.o.   MRN: [id number]  HPI Patient is seen to establish care. She has generally been very healthy and very competitive long-distance runner for many years. She had episode of somewhat atypical chest pain this past summer in June and presented to urgent care for further evaluation. She was subsequently referred to emergency department and initial EKGs revealed no significant acute changes. She had mild elevation of cardiac enzymes and was ruled in for NSTEMI.    This was somewhat surprising given the fact she's not had any family history of premature CAD nor history of smoking, diabetes, or hypertension. Her lipids were minimally elevated. She's been very competitive runner for several years. Catheterization revealed occluded large diagonal branch as well as circumflex stenosis. She was initially discharged on aspirin, Effient, Lipitor, and Toprol. She was unable to tolerate Toprol secondary to low blood pressure. She has resumed running about 40 miles per week at this time. She's had no further chest symptoms and again her symptoms on presentation were very atypical. She's had some myalgias as not taking the Lipitor consistently.  Past medical history otherwise is unrevealing. She is married. She works as a Naval architect. Never smoked. No alcohol use. Gets regular GYN followup.  Past Medical History  Diagnosis Date  . CAD (coronary artery disease), native coronary artery 04/2013    - 100% D2, ~90% Cx-OM1; EF 45-50%  . NSTEMI - S/P PCI and stenting x 2 of Circumflex and Diagonal Branch w/ DES 04/12/13 04/12/2013    Pathophysiology consistent with STEMI with occluded diagonal branch he  . Presence of drug coated stent in a Diagonal branch of the LAD coronary artery 04/12/2013    Xence EXpd DES 2.25 mm x 15 mm - post-dil to 2.4 mm  . Presence of drug coated stent in left circumflex coronary artery 04/12/2013    Xience  EXpd DES 3.0 mm x 23 mm - post-dil to 3.25 mm  . Piriformis muscle pain     LLE  . History of pneumonia ~2002    "walking PNA  . Dyslipidemia, goal LDL below 70     Reduced to 5 mg Crestor  . Fainting spell    Past Surgical History  Procedure Laterality Date  . Finger fracture surgery Left ~2008    "ring finger" (04/11/2013)  . Coronary angioplasty with stent placement  03/12/13    Xience Exp. 3.60mm x 23 mm (post-dil to 3.34mm) - OM1; Xience Exp. 2.25 mm x 15 mm - Diag 2 (post-dil 2.4 mm)    reports that she has never smoked. She has never used smokeless tobacco. She reports that she does not drink alcohol or use illicit drugs. family history includes Alcohol abuse in her mother; Hyperlipidemia in her father and mother; Hypertension in her father. No Known Allergies    Review of Systems  Constitutional: Negative for appetite change, fatigue and unexpected weight change.  Respiratory: Negative for cough and shortness of breath.   Cardiovascular: Negative for chest pain, palpitations and leg swelling.  Gastrointestinal: Negative for abdominal pain.  Genitourinary: Negative for dysuria.  Neurological: Negative for dizziness and syncope.  Hematological: Negative for adenopathy. Does not bruise/bleed easily.  Psychiatric/Behavioral: Negative for dysphoric mood.       Objective:   Physical Exam  Constitutional: She appears well-developed and well-nourished.  HENT:  Right Ear: External ear normal.  Left Ear: External ear normal.  Mouth/Throat: Oropharynx  is clear and moist.  Neck: Neck supple. No thyromegaly present.  Cardiovascular: Normal rate and regular rhythm.  Exam reveals no gallop.   Pulmonary/Chest: Effort normal and breath sounds normal. No respiratory distress. She has no wheezes. She has no rales.  Musculoskeletal: She exhibits no edema.  Psychiatric: She has a normal mood and affect. Her behavior is normal.          Assessment & Plan:  History of CAD with recent  non-ST elevation MI as above. She underwent successful stenting of circumflex and diagonal branch lesions. She has resumed running without difficulty. She has close followup with cardiology. We've recommended flu vaccine this was given. Cardiology at this point is monitoring her lipids Mild dyslipidemia.  Pt on statin, though somewhat inconsistent use.

## 2013-10-02 ENCOUNTER — Ambulatory Visit (INDEPENDENT_AMBULATORY_CARE_PROVIDER_SITE_OTHER): Payer: Managed Care, Other (non HMO) | Admitting: Cardiology

## 2013-10-02 ENCOUNTER — Encounter: Payer: Self-pay | Admitting: Cardiology

## 2013-10-02 VITALS — BP 123/71 | HR 79 | Ht 65.0 in | Wt 127.8 lb

## 2013-10-02 DIAGNOSIS — I2510 Atherosclerotic heart disease of native coronary artery without angina pectoris: Secondary | ICD-10-CM

## 2013-10-02 DIAGNOSIS — E785 Hyperlipidemia, unspecified: Secondary | ICD-10-CM

## 2013-10-02 DIAGNOSIS — Z7902 Long term (current) use of antithrombotics/antiplatelets: Secondary | ICD-10-CM

## 2013-10-02 DIAGNOSIS — I214 Non-ST elevation (NSTEMI) myocardial infarction: Secondary | ICD-10-CM

## 2013-10-02 LAB — HIGH SENSITIVITY CRP: CRP, High Sensitivity: 0.5 mg/L

## 2013-10-02 NOTE — Patient Instructions (Signed)
Ok - lets stop Aspirin.  Stay on the Effient. We will check your choleserol levels  CRP.    I will try to get before & after pictures of your angiograms.  I will see you back in ~6 months, unless there are issues.  Marykay Lex, MD

## 2013-10-04 ENCOUNTER — Other Ambulatory Visit: Payer: Self-pay | Admitting: *Deleted

## 2013-10-04 ENCOUNTER — Telehealth: Payer: Self-pay | Admitting: Cardiology

## 2013-10-04 ENCOUNTER — Encounter: Payer: Self-pay | Admitting: Cardiology

## 2013-10-04 DIAGNOSIS — Z7902 Long term (current) use of antithrombotics/antiplatelets: Secondary | ICD-10-CM | POA: Insufficient documentation

## 2013-10-04 DIAGNOSIS — I2510 Atherosclerotic heart disease of native coronary artery without angina pectoris: Secondary | ICD-10-CM

## 2013-10-04 NOTE — Assessment & Plan Note (Addendum)
Currently stable with no recurrent symptoms. Low risk Myoview.  Really intolerant to many medications for blood pressure. Currently on aspirin plus Effient. Failed attempt to switch to Plavix. Will discontinue aspirin.  He is asked for a copy of her angiography films. We'll try to get this for her.

## 2013-10-04 NOTE — Telephone Encounter (Signed)
I advised patient that she will need to go back to the lab.  I apologized for this inconvenience.

## 2013-10-04 NOTE — Telephone Encounter (Signed)
Wants lab results from Monday(10-02-13)please.

## 2013-10-04 NOTE — Assessment & Plan Note (Signed)
With 2 DES stents placed in the setting of a non-STEMI, she will need to be on antiplatelet therapy for at least a year. She does have a relatively small diameter stent in the diagonal branch. I 8 overall preference would be to continue on beyond that timeframe. However the left tibia per discussion her and it would support continued use of antiplatelet agents. I do think however we can stop the aspirin since she is having significant vaginal bleeding with her menstrual cycles.

## 2013-10-04 NOTE — Assessment & Plan Note (Signed)
3 Will Ingold. I think her diet will probably keep her there. I will check a lipid panel and chemistry panel in the upcoming weeks. She is also asked that we check CRP levels, site ordered a high-sensitivity CRP level as a potential tiebreaker for whether or not she goes back on a statin.

## 2013-10-04 NOTE — Telephone Encounter (Signed)
I called patient and gave CRP results.  Patient thought that other labs were drawn as well, but they were not.  The other labs orders were placed as future.  I called [facility] and verified that the other labs (lipid, heptatic, bmp) were not drawn.  I will release the order and have the patient return to the lab.

## 2013-10-04 NOTE — Assessment & Plan Note (Addendum)
Today she seems finally to be over her PTSD from this. Recovered well with no recurrent symptoms. EF normal by LV gram and nuclear stress test.

## 2013-10-04 NOTE — Progress Notes (Signed)
Clinic Follow-Up Note: Patient ID: Robin Tapia, female   DOB: 09-Mar-1966, 47 y.o.   MRN: [id number] PCP: Kristian Covey, MD  Chief Complaint:  Chief Complaint  Patient presents with  . Follow-up    4 month:  No chest pain, SOB, edema or palpitations.  Inceased bleeding w/periods causing symptoms of anemia.  Started herself on Iron supplements.  Off Crestor 3 weeks because of running races. Would like to get lab work done today - she is fasting.    HPI: Robin Tapia is a 47 y.o. female, competitive runner who now has PMH of severe 2 vessel CAD status post non-STEMI.   She had some unusual chest discomfort symptoms while exercising back in June. She presented to [facility] cone was done and a non-ST elevation MI. Reluctantly she went for cardiac catheterization by Dr. Allyson Sabal. This showed a significant circumflex/OM lesion as well as an occluded diagonal branch there was a large branching vessel. Both vessels were treated with drug-eluting stents. Her post-PCI course was troubled by what amounted to be anxiety attacks. At discharge she presented back to the clinic with what amounted be anxiety attack. So far since then, there is been relatively well-controlled. Her major concerns and in with her weight being down, that she is at increased risk of bleeding with Effient. We gave a trial of Plavix, but she did not tolerate this due to GI upset. She is also very concerned about her statin. She was concerned with how would make her feel with myalgias but her competitive running. Her most recent lipid panel showed go to the significant improvement. However she's been doing a very rigid/strict diet and is totally went into the other the diet and his believes about cardiovascular disease and how diet alone can lead to reduction in plaque burden.  She had a MMR panel checked by Dr. Tresa Endo back in June which showed significant lipid level improvement. She's also had a treadmill Myoview that was read  as low risk with a mild anterolateral partially reversible perfusion defect consistent with mild infarct and ischemia.  Interval History: From a cardiac standpoint, she is really doing well. She is back doing her exercising and running with no chest tightness or pressure. She is really not having any significant anxiety episodes bowels well. Her major concern is that she is having increased bleeding during her menstrual cycle. In her cycle is also a bit altered with her coming every 3 weeks or so. She says it usually by the end of the month after her period and her blood counts are back to normal. She is taking iron supplementation however.  No chest pain or shortness of breath with rest or exertion. No PND, orthopnea or edema. No palpitations, lightheadedness, dizziness, weakness or syncope/near syncope. No TIA/amaurosis fugax symptoms. No melena, hematochezia hematuria.  But notable increased vaginal bleeding with menstrual cycle.  Past Medical History  Diagnosis Date  . CAD (coronary artery disease), native coronary artery 04/2013    - 100% D2, ~90% Cx-OM1; EF 45-50%  . NSTEMI - S/P PCI and stenting x 2 of Circumflex and Diagonal Branch w/ DES 04/12/13 04/12/2013    Pathophysiology consistent with STEMI with occluded diagonal branch he  . Presence of drug coated stent in a Diagonal branch of the LAD coronary artery 04/12/2013    Xence EXpd DES 2.25 mm x 15 mm - post-dil to 2.4 mm  . Presence of drug coated stent in left circumflex coronary artery 04/12/2013  Xience EXpd DES 3.0 mm x 23 mm - post-dil to 3.25 mm  . Piriformis muscle pain     LLE  . History of pneumonia ~2002    "walking PNA  . Dyslipidemia, goal LDL below 70     Reduced to 5 mg Crestor  . Fainting spell     Prior Cardiac Evaluation and Past Surgical History: Past Surgical History  Procedure Laterality Date  . Finger fracture surgery Left ~2008    "ring finger" (04/11/2013)  . Coronary angioplasty with stent placement  03/12/13     Xience Exp. 3.82mm x 23 mm (post-dil to 3.42mm) - OM1; Xience Exp. 2.25 mm x 15 mm - Diag 2 (post-dil 2.4 mm)  . Nm myoview ltd  05/30/2013    Low Risk nuclear stress test with small area of mild perfusion defect in the anterolateral wall. Normal function. -- Consistent with diagonal branch disease    No Known Allergies  Current Outpatient Prescriptions  Medication Sig Dispense Refill  . ALPRAZolam (XANAX) 0.25 MG tablet Take 1 tablet (0.25 mg total) by mouth 3 (three) times daily as needed for anxiety.  90 tablet  2  . aspirin EC 81 MG tablet Take 81 mg by mouth daily.      Marland Kitchen co-enzyme Q-10 30 MG capsule Take 30 mg by mouth 3 (three) times daily.      . cyanocobalamin 100 MCG tablet Take 100 mcg by mouth daily.      . ferrous fumarate (HEMOCYTE - 106 MG FE) 325 (106 FE) MG TABS tablet Take 1 tablet by mouth daily.      . nitroGLYCERIN (NITROSTAT) 0.4 MG SL tablet Place 1 tablet (0.4 mg total) under the tongue every 5 (five) minutes x 3 doses as needed for chest pain.  25 tablet  2  . prasugrel (EFFIENT) 10 MG TABS tablet Take 1 tablet (10 mg total) by mouth daily.  28 tablet  0  . rosuvastatin (CRESTOR) 10 MG tablet Take 5 mg by mouth daily. Take 1 tab daily       No current facility-administered medications for this visit.    History   Social History  . Marital Status: Married    Spouse Name: N/A    Number of Children: N/A  . Years of Education: N/A   Occupational History  . Not on file.   Social History Main Topics  . Smoking status: Never Smoker   . Smokeless tobacco: Never Used  . Alcohol Use: No  . Drug Use: No  . Sexual Activity: Yes    Birth Control/ Protection: None   Other Topics Concern  . Not on file   Social History Narrative   Socially she is in her second marriage. She has 4 children ages 43 and 39 from her first marriage ages 31 and 74 for her second marriage. Has no tobacco history but she admits to a lifelong commitment to exercise and running  competitively. She is eagerly and has been getting back into full training, and competitions.    ROS: A comprehensive Review of Systems - Negative except pertinence and as noted above.  PHYSICAL EXAM BP 123/71  Pulse 79  Ht 5\' 5"  (1.651 m)  Wt 127 lb 12.8 oz (57.97 kg)  BMI 21.27 kg/m2 General appearance: Alert and oriented x3. Well-nourished and well-groomed. Overall healthy-appearing. She continues to be mildly anxious but much better than last time Neck: Supple, no LAN her JVD. No bruit Lungs: CTA B., normal percussion bilaterally and  Nonlabored, good air movement. Heart: RRR, S1, S2 normal, no murmur, click, rub or gallop and normal apical impulse Abdomen: soft, non-tender; bowel sounds normal; no masses,  no organomegaly Extremities: No C./C./E. Normal pulses bilaterally. Neurologic: Grossly normal HEENT: [place]/AT, EOMI, MMM, anicteric sclera  EKG: Adult ECG Report  Rate: 79 bpm  Rhythm: normal sinus rhythm  QRS Axis: 72 ms  PR Interval: 164 ms  QRS Duration: 78 ms  QTc: 472 ms  Voltages: Normal  Conduction Disturbances: none  Other Abnormalities: none   Narrative Interpretation: Normal ECG   Recent Labs: No new labs  ASSESSMENT / PLAN: Finally stabilizing following her recent MI and multivessel PCI. I think her PTSD phase is over, with less overall anxiety.    CAD (coronary artery disease), native coronary artery - NSTEMI - S/P PCI x 2 of Circumflex and Diag 2 w/ DES 04/12/13 Currently stable with no recurrent symptoms. Low risk Myoview.  Really intolerant to many medications for blood pressure. Currently on aspirin plus Effient. Failed attempt to switch to Plavix. Will discontinue aspirin.  He is asked for a copy of her angiography films. We'll try to get this for her.  Non-ST elevated myocardial infarction (non-STEMI) Today she seems finally to be over her PTSD from this. Recovered well with no recurrent symptoms. EF normal by LV gram and nuclear stress  test.  Dyslipidemia, goal LDL below 70 3 Will Ingold. I think her diet will probably keep her there. I will check a lipid panel and chemistry panel in the upcoming weeks. She is also asked that we check CRP levels, site ordered a high-sensitivity CRP level as a potential tiebreaker for whether or not she goes back on a statin.  Long term current use of antithrombotics/antiplatelets With 2 DES stents placed in the setting of a non-STEMI, she will need to be on antiplatelet therapy for at least a year. She does have a relatively small diameter stent in the diagonal branch. I 8 overall preference would be to continue on beyond that timeframe. However the left tibia per discussion her and it would support continued use of antiplatelet agents. I do think however we can stop the aspirin since she is having significant vaginal bleeding with her menstrual cycles.  Refill Xanax.   Orders Placed This Encounter  Procedures  . Lipid panel    Standing Status: Future     Number of Occurrences:      Standing Expiration Date: 10/02/2014  . Hepatic function panel    Standing Status: Future     Number of Occurrences:      Standing Expiration Date: 10/02/2014  . Basic metabolic panel    Standing Status: Future     Number of Occurrences:      Standing Expiration Date: 10/02/2014  . CRP High sensitivity  . EKG 12-Lead   Followup: 6 months  HARDING,DAVID W, M.D., M.S. [facility] [address] [place], [place]  [address]  [phone] Pager # [phone]

## 2013-10-11 ENCOUNTER — Telehealth: Payer: Self-pay | Admitting: *Deleted

## 2013-10-11 NOTE — Telephone Encounter (Signed)
Message copied by Tobin Chad on Wed Oct 11, 2013 11:49 AM ------      Message from: [name], DAVID W      Created: Mon Oct 02, 2013 11:33 PM       CRP result is low, therefore this is low risk. ------

## 2013-10-11 NOTE — Telephone Encounter (Signed)
Spoke to patient. CRP  Result given . Verbalized understanding

## 2013-10-16 LAB — LIPID PANEL
Cholesterol: 182 mg/dL (ref 0–200)
HDL: 46 mg/dL (ref >39)
Total CHOL/HDL Ratio: 4 Ratio

## 2013-10-16 LAB — HEPATIC FUNCTION PANEL
ALT: 14 U/L (ref 0–35)
AST: 19 U/L (ref 0–37)
Alkaline Phosphatase: 61 U/L (ref 39–117)
Bilirubin, Direct: 0.1 mg/dL (ref 0.0–0.3)
Total Bilirubin: 0.6 mg/dL (ref 0.3–1.2)

## 2013-10-16 LAB — BASIC METABOLIC PANEL
BUN: 8 mg/dL (ref 6–23)
Calcium: 9.8 mg/dL (ref 8.4–10.5)
Chloride: 103 mEq/L (ref 96–112)
Glucose, Bld: 97 mg/dL (ref 70–99)
Sodium: 139 mEq/L (ref 135–145)

## 2013-10-19 ENCOUNTER — Telehealth: Payer: Self-pay | Admitting: Cardiology

## 2013-10-19 NOTE — Telephone Encounter (Signed)
She wants her lab result from Monday please.

## 2013-10-19 NOTE — Telephone Encounter (Signed)
Message forwarded to S. Martin, RN.  

## 2013-10-20 NOTE — Telephone Encounter (Signed)
Pt stated that she would like her test results from Monday. She stated that no one called her back yesterday so she wanted to come by and pick up the results if she can.

## 2013-10-20 NOTE — Telephone Encounter (Signed)
Spoke to patient. Result given . Verbalized understanding. Patient still states she is having cramps form the medication. Will have to contact patient later- not sure of the Livalo dosage. Will defer to Dr [name].  Patient aware will send in prescription and she can pick up samples.

## 2013-10-20 NOTE — Telephone Encounter (Signed)
Message copied by Tobin Chad on Fri Oct 20, 2013  2:05 PM ------      Message from: Marykay Lex      Created: Wed Oct 18, 2013 10:47 PM       Unfortunately, despite notable improvement triglycerides, the LDL level is actually gone up. I think organ hapten tried to take some of the Crestor even if it's 5 mg every other day. If that is still causing cramps, we will switch to Livalo.            Marykay Lex, MD       ------

## 2013-10-20 NOTE — Telephone Encounter (Signed)
Message has already been forwarded to [facility], Charity fundraiser.

## 2013-10-20 NOTE — Telephone Encounter (Signed)
PATIENT RETURNED CALL. SHE STATED THAT SHE HAS NOT BEEN USING CRESTOR FOR OVER 6 WEEKS WHEN THIS LB WAS DRAWN. SHE DOES NOT WANT TO START ANY STATINS FOR AT LEAST 3 MONTHS AND AT THAT TIME SHE WOULD LIKE TO HAVE LIPIDS REDRAWN. SHE STATES SHE WOULD TO SEE IF EXERCISE ,DIET,AND TAKING FISH OIL WILL HELP.

## 2013-10-21 NOTE — Progress Notes (Signed)
OK.  I don't think there will be any notable change, but it is her choice.  Marykay Lex, MD

## 2013-10-21 NOTE — Telephone Encounter (Signed)
OK - it is her choice.    Give her plan a try.    Marykay Lex, MD

## 2013-10-25 NOTE — Telephone Encounter (Signed)
Informed patient -Dr [name] okay concerning recheck labs in 3 months.  Will mail  labslip in about 2 months.(cmp,lipids)  Patient aware and verbalized understanding.

## 2013-12-27 ENCOUNTER — Telehealth: Payer: Self-pay | Admitting: *Deleted

## 2013-12-27 ENCOUNTER — Other Ambulatory Visit: Payer: Self-pay | Admitting: *Deleted

## 2013-12-27 ENCOUNTER — Encounter: Payer: Self-pay | Admitting: *Deleted

## 2013-12-27 DIAGNOSIS — Z79899 Other long term (current) drug therapy: Secondary | ICD-10-CM

## 2013-12-27 DIAGNOSIS — E782 Mixed hyperlipidemia: Secondary | ICD-10-CM

## 2013-12-27 NOTE — Telephone Encounter (Signed)
Mailed letter , labslip

## 2013-12-27 NOTE — Telephone Encounter (Signed)
Message copied by Raiford Simmonds on Wed Dec 27, 2013  6:12 PM ------      Message from: [place], [place]: Wed Oct 25, 2013  4:29 PM       MAIL LABSLIP IN FEB 2015---CMP,LIPID            NEED TO DO AN ORDER THAT TIME.            PATIENT NEEDS TO LABS IN MARCH 2015 ------

## 2014-01-24 ENCOUNTER — Telehealth: Payer: Self-pay | Admitting: Cardiology

## 2014-01-24 NOTE — Telephone Encounter (Signed)
Returned call and pt verified x 2.  Pt stated over the past 5 weeks she has had 3 episodes of panicking and worrying.  Stated she would feel like "a fight or flight response."  Stated her heart races and then it would slow down.  Stated she would be able to talk herself down.  Stated she has noticed it has been more when she has been excited or stressed.  Stated when it happens she has to go to the bathroom, "number 2."  Stated she took a Xanax and calmed herself down.  Pt stated they are happening more frequently.  Also stated BP was a little elevated for her one time.  Denied CP, SOB or radiating pain.  Also denied back pain, which was a symptom she had when she had her heart attack in June of last year.  Pt stated she had the last episode at 2 am this morning.  Also stated she ran 5 miles today w/o any complications.  Advice  Take Xanax as needed, preferably in the evenings before bed since symptoms only occur in the middle of the night  Relax  Avoid Stressors  Call back for an appt w/ Dr. Ellyn Hack or an Extender if needed  ER for CP/pressure w/ Back pain or other pain, sweating, nausea, SOB  If need to take Xanax TID every day, then contact PCP for further evaluation of anxiety  Pt verbalized understanding and agreed w/ plan.  Pt declined offer for appt w/ an Extender for evaluation to r/o heart.

## 2014-01-24 NOTE — Telephone Encounter (Signed)
Agree with PRN.  Leonie Man, MD

## 2014-01-24 NOTE — Telephone Encounter (Signed)
Please call-been anxiety attack. She had one last night,very concerned.

## 2014-01-30 LAB — COMPREHENSIVE METABOLIC PANEL
ALT: 17 U/L (ref 0–35)
AST: 20 U/L (ref 0–37)
Albumin: 4.2 g/dL (ref 3.5–5.2)
Alkaline Phosphatase: 58 U/L (ref 39–117)
BUN: 11 mg/dL (ref 6–23)
CHLORIDE: 103 meq/L (ref 96–112)
CO2: 27 mEq/L (ref 19–32)
CREATININE: 0.67 mg/dL (ref 0.50–1.10)
Calcium: 9.4 mg/dL (ref 8.4–10.5)
GLUCOSE: 103 mg/dL — AB (ref 70–99)
Potassium: 4.4 mEq/L (ref 3.5–5.3)
Sodium: 139 mEq/L (ref 135–145)
Total Bilirubin: 0.6 mg/dL (ref 0.2–1.2)
Total Protein: 6.9 g/dL (ref 6.0–8.3)

## 2014-01-30 LAB — LIPID PANEL
CHOL/HDL RATIO: 3.4 ratio
Cholesterol: 187 mg/dL (ref 0–200)
HDL: 55 mg/dL (ref >39)
LDL Cholesterol: 107 mg/dL — ABNORMAL HIGH (ref 0–99)
Triglycerides: 123 mg/dL (ref <150)
VLDL: 25 mg/dL (ref 0–40)

## 2014-02-02 ENCOUNTER — Telehealth: Payer: Self-pay | Admitting: *Deleted

## 2014-02-02 NOTE — Telephone Encounter (Signed)
Message copied by Raiford Simmonds on Fri Feb 02, 2014  6:05 PM ------      Message from: Leonie Man      Created: Wed Jan 31, 2014 12:00 AM       Unfortunately the glucose levels and cholesterol levels are not quite where we would like them.      LDL is better than it was 2 months ago, but still not as good as when on low-dose Crestor.      We can discuss other options in followup appointment. If possible if she could try take a half of a Crestor tablet every other day and see how she does with that.            Leonie Man, MD       ------

## 2014-02-02 NOTE — Telephone Encounter (Signed)
Pt was calling in regards to her blood work results from Monday.  [place]

## 2014-02-02 NOTE — Telephone Encounter (Signed)
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  CHANGE DIET TO PLANT BASE ,NO MEAT,  RN suggest she discuss with one of the groups pharmacist about options. Will contact her next week after discussing  appointment time

## 2014-02-07 NOTE — Telephone Encounter (Signed)
Forward to [place] Please make an appointment with Robin Tapia

## 2014-02-08 NOTE — Telephone Encounter (Signed)
Spoke with patient, appt scheduled for April 14 at 10:30am

## 2014-02-20 ENCOUNTER — Ambulatory Visit (INDEPENDENT_AMBULATORY_CARE_PROVIDER_SITE_OTHER): Payer: Managed Care, Other (non HMO) | Admitting: Pharmacist

## 2014-02-20 VITALS — Wt 129.0 lb

## 2014-02-20 DIAGNOSIS — E785 Hyperlipidemia, unspecified: Secondary | ICD-10-CM

## 2014-02-20 NOTE — Progress Notes (Signed)
Patient is a pleasant 48 y.o. WF referred to lipid clinic by Dr. Ellyn Hack.  Patient had a NSTEMI 04/2013 with PCI x 2.  Her LDL at the time was ~ 110 mg/dL.  She was started on lipitor 80 mg qd at discharge, but soon after developed muscle soreness in her feet and legs.  She was changed to Crestor 10 mg qd, but due to muscle aches had to reduce to 5 mg qd.  Crestor 5 mg qd also caused severe muscle soreness, and so reduced to 5 mg twice weekly.  This dose of Crestor caused leg / foot muscle aches as well, so she discontinued statins, and has felt better since.  She is a competitive marathon runner and really wants to avoid any medication that may cause muscle or joint stiffness.  Her LDL on lipitor was 58, and LDL-P number was 830, however she is not able to tolerate daily statin.  She has changed her diet to a plant based diet, and is avoiding saturated fats and high cholesterol foods.  Patient not wanting to use another statin if possible and wants to discuss other options.  We discussed Zetia, Welchol, and PCSK-9 inhibitors.  Given lack of monotherapy data with Zetia and Welchol, and potency and great tolerability of PCSK-9 inhibitors, she would like to explore the clinical trial.  She is aware they may be on the market in the next 6 months as well.  No history of cancer.  Risk Factors:  MI (04/2013), PCI x 2 - LDL goal < 70, non-HDL goal <100 Meds:  Not on lipid lowering meds currently. Intolerant:  Crestor 5 mg qd, 10 mg qd, and 5 mg twice weekly (muscle aches); Lipitor 80 mg qd (muscle aches)  Labs:   01/2014 - LDL 107, TC 187, TG 123, HDL 55 (not on meds, but on plant based diet and running marathons) 07/2012 - TC 241, LDL 177, TG 65, HDL 51 (eating horribly and not running)  Current Outpatient Prescriptions  Medication Sig Dispense Refill  . ALPRAZolam (XANAX) 0.25 MG tablet Take 1 tablet (0.25 mg total) by mouth 3 (three) times daily as needed for anxiety.  90 tablet  2  . aspirin EC 81 MG tablet  Take 81 mg by mouth daily.      Marland Kitchen co-enzyme Q-10 30 MG capsule Take 30 mg by mouth 3 (three) times daily.      . cyanocobalamin 100 MCG tablet Take 100 mcg by mouth daily.      . ferrous fumarate (HEMOCYTE - 106 MG FE) 325 (106 FE) MG TABS tablet Take 1 tablet by mouth daily.      . nitroGLYCERIN (NITROSTAT) 0.4 MG SL tablet Place 1 tablet (0.4 mg total) under the tongue every 5 (five) minutes x 3 doses as needed for chest pain.  25 tablet  2  . prasugrel (EFFIENT) 10 MG TABS tablet Take 1 tablet (10 mg total) by mouth daily.  28 tablet  0   No current facility-administered medications for this visit.   Allergies  Allergen Reactions  . Crestor [Rosuvastatin]     Muscle aches at 10 mg daily, 5 mg daily, and 5 mg twice weekly  . Lipitor [Atorvastatin]     Muscle aches    Family History  Problem Relation Age of Onset  . Hypertension Father   . Hyperlipidemia Father   . Alcohol abuse Mother   . Hyperlipidemia Mother

## 2014-02-20 NOTE — Assessment & Plan Note (Signed)
I have passed her name and information on to the [employer] to contact about enrolling into [employer].  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  She will notify me if any problems occur.

## 2014-03-29 ENCOUNTER — Ambulatory Visit (INDEPENDENT_AMBULATORY_CARE_PROVIDER_SITE_OTHER): Payer: Managed Care, Other (non HMO) | Admitting: Cardiology

## 2014-03-29 VITALS — BP 110/72 | HR 64 | Ht 65.0 in | Wt 127.9 lb

## 2014-03-29 DIAGNOSIS — I214 Non-ST elevation (NSTEMI) myocardial infarction: Secondary | ICD-10-CM

## 2014-03-29 DIAGNOSIS — F4310 Post-traumatic stress disorder, unspecified: Secondary | ICD-10-CM

## 2014-03-29 DIAGNOSIS — E785 Hyperlipidemia, unspecified: Secondary | ICD-10-CM

## 2014-03-29 DIAGNOSIS — I2510 Atherosclerotic heart disease of native coronary artery without angina pectoris: Secondary | ICD-10-CM

## 2014-03-29 DIAGNOSIS — F410 Panic disorder [episodic paroxysmal anxiety] without agoraphobia: Secondary | ICD-10-CM

## 2014-03-29 MED ORDER — ALPRAZOLAM 0.25 MG PO TABS: 0.2500 mg | ORAL_TABLET | Freq: Three times a day (TID) | ORAL | Status: DC | PRN

## 2014-03-29 NOTE — Patient Instructions (Addendum)
Your physician recommends that you schedule a follow-up appointment in: 1 year--30 in appointment   Your physician has recommended you make the following change in your medication: Decrease Effient to 5 mg daily

## 2014-04-01 ENCOUNTER — Encounter: Payer: Self-pay | Admitting: Cardiology

## 2014-04-01 DIAGNOSIS — F410 Panic disorder [episodic paroxysmal anxiety] without agoraphobia: Secondary | ICD-10-CM | POA: Insufficient documentation

## 2014-04-01 NOTE — Assessment & Plan Note (Signed)
Doing well without any recurrent symptoms.  No evidence of scar on Myoview. I think her PTSD phase may be over, but still has intermittent Panic Attacks.  The very fact that she feels comfortable taking her trip to [place] is a major step.  I congratulated her on this major step.

## 2014-04-01 NOTE — Assessment & Plan Note (Signed)
PRN Xanax 

## 2014-04-01 NOTE — Assessment & Plan Note (Signed)
Improved, but still has intermittent panic attacks.  PRN Xanax.

## 2014-04-01 NOTE — Assessment & Plan Note (Addendum)
Stable.  Failed attempt with Plavix - GI intolerance.  Menstrual cycle still heavy even off of ASA.  With her weight being just below the lower range for Effient 10mg , I will reduce her dose to 5 mg. She is borderline hypotensive without medications -- so no BB or ACE-I/ARB

## 2014-04-01 NOTE — Progress Notes (Signed)
Clinic Follow-Up Note: Patient ID: Robin Tapia, female   DOB: 1966-05-12, 48 y.o.   MRN: [id number] PCP: Eulas Post, MD  Chief Complaint:  Chief Complaint  Patient presents with  . 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    Her post-PCI course was troubled by what amounted to be anxiety attacks. At discharge she presented back to the clinic with what amounted be anxiety attack. So far since then, there is been relatively well-controlled. Her major concerns and in with her weight being down, that she is at increased risk of bleeding with Effient. We gave a trial of Plavix, but she did not tolerate this due to GI upset. She has been to our Lipid clinic to discuss management & it looks like she will be starting on a Trial medication. She had a negative Myoview in July 2014 to assess chest discomfort that did not show evidence of ischemia.  Interval History: From a cardiac standpoint, she is really doing well. She is back doing her exercising and running with no chest tightness / pressure or dyspnea.  She proudly told me that she won the [employer] division of the [employer] 5 K run last month and is planning to go to [place] with her daughter on the trip that was post-poned due to her MI.   A few months back, she had a few Panic Attacks during which she felt that she was going to have another MI.  These episodes are actually happening less frequently, but still trouble her. She says that the menstrual bleeding is still an issue, albeit less since stopping ASA.  She can tell that she is a bit anemic for a week or 2 into the next cycle.  No chest pain or shortness of breath with rest or exertion. No PND, orthopnea or edema. No palpitations, lightheadedness, dizziness,  weakness or syncope/near syncope. No TIA/amaurosis fugax symptoms. No melena, hematochezia, hematuria  Or epistaxis.  But notable increased vaginal bleeding with menstrual cycle.  Past Medical History  Diagnosis Date  . CAD (coronary artery disease), native coronary artery 04/2013    - 100% D2, ~90% Cx-OM1; EF 45-50%  . NSTEMI - S/P PCI and stenting x 2 of Circumflex and Diagonal Branch w/ DES 04/12/13 04/12/2013    Pathophysiology consistent with STEMI with occluded diagonal branch he  . Presence of drug coated stent in a Diagonal branch of the LAD coronary artery 04/12/2013    Xence EXpd DES 2.25 mm x 15 mm - post-dil to 2.4 mm  . Presence of drug coated stent in left circumflex coronary artery 04/12/2013    Xience EXpd DES 3.0 mm x 23 mm - post-dil to 3.25 mm  . Piriformis muscle pain     LLE  . History of pneumonia ~2002    "walking PNA  . Dyslipidemia, goal LDL below 70     Reduced to 5 mg Crestor  . Fainting spell     Prior Cardiac Evaluation/ Procedure /Surgical History:  Procedure Laterality Date  . Coronary angioplasty with stent placement  03/12/13    Xience Exp. 3.64mm x 23 mm (post-dil to 3.18mm) - OM1; Xience Exp. 2.25 mm x 15 mm - Diag 2 (post-dil 2.4 mm)  . Nm myoview ltd  05/30/2013    Low Risk nuclear stress test with small area of mild perfusion defect in the anterolateral wall. Normal function. -- Consistent with diagonal branch disease    Allergies  Allergen Reactions  . Crestor [Rosuvastatin]     Muscle aches at 10 mg daily, 5 mg daily, and 5 mg twice weekly  . Lipitor [Atorvastatin]     Muscle aches     Current Outpatient Prescriptions  Medication Sig Dispense Refill  . ALPRAZolam (XANAX) 0.25 MG tablet Take 1 tablet (0.25 mg total) by mouth 3 (three) times daily as needed for anxiety.  90 tablet  2  . aspirin EC 81 MG tablet Take 81 mg by mouth daily.      . Coenzyme Q10 Liposomal 100 MG/ML LIQD Take 1 mL by mouth daily.      . cyanocobalamin 100 MCG tablet Take  100 mcg by mouth daily.      . ferrous sulfate 300 (60 FE) MG/5ML syrup Take 300 mg by mouth as needed.      . nitroGLYCERIN (NITROSTAT) 0.4 MG SL tablet Place 1 tablet (0.4 mg total) under the tongue every 5 (five) minutes x 3 doses as needed for chest pain.  25 tablet  2  . prasugrel (EFFIENT) 10 MG TABS tablet Take 5 mg by mouth daily.       No current facility-administered medications for this visit.   History   Social History Narrative   Socially she is in her second marriage. She has 4 children ages 1 and 58 from her first marriage ages 19 and 55 for her second marriage. Has no tobacco history but she admits to a lifelong commitment to exercise and running competitively. She is eagerly and has been getting back into full training, and competitions.    ROS: A comprehensive Review of Systems - Negative except pertinence and as noted above.  PHYSICAL EXAM BP 110/72  Pulse 64  Ht 5\' 5"  (1.651 m)  Wt 127 lb 14.4 oz (58.015 kg)  BMI 21.28 kg/m2 General appearance: Alert and oriented x3. Well-nourished and well-groomed. Overall healthy-appearing. She continues to be mildly anxious but much better than last time Neck: Supple, no LAN her JVD. No bruit Lungs: CTA B., normal percussion bilaterally and Nonlabored, good air movement. Heart: RRR, S1, S2 normal, no murmur, click, rub or gallop and normal apical impulse Abdomen: soft, non-tender; bowel sounds normal; no masses,  no organomegaly Extremities: No C./C./E. Normal pulses bilaterally. Neurologic: Grossly normal HEENT: [place]/AT, EOMI, MMM, anicteric sclera  EKG: Adult ECG Report  Rate: 64 bpm;  Rhythm: normal sinus rhythm  QRS Axis: 78 ms;  PR Interval: 180 ms;  QRS Duration: 78 ms;  QTc: 455 ms;  Voltages: Normal  Conduction Disturbances: none;  Other Abnormalities: none  Narrative Interpretation: Normal ECG  Recent Labs: 01/29/2014: TC 187, TG 123, HDL 55, LDL 107  ASSESSMENT / PLAN: Finally stabilizing following her recent MI  and multivessel PCI.    Non-ST elevated myocardial infarction (non-STEMI) Doing well without any recurrent symptoms.  No evidence of scar on Myoview. I think her PTSD phase may be over, but still has intermittent Panic Attacks.  The very fact that she feels comfortable taking her trip to [place] is a major step.  I congratulated her on this major step.   CAD (coronary artery disease), native coronary artery - NSTEMI - S/P PCI x 2 of Circumflex and Diag 2 w/ DES 04/12/13 Stable.  Failed attempt with Plavix - GI intolerance.  Menstrual cycle still heavy even off of ASA.  With her weight being just below the lower range for Effient 10mg , I will reduce her dose to 5 mg. She is borderline hypotensive without medications -- so no BB or ACE-I/ARB    Dyslipidemia, goal LDL below 70 Much improved, but LDL is still not at goal.  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 She is planning to enroll in  [place] through the help of [facility], [facility] (who runs our lipid clinic)  Post-traumatic stress reaction Improved, but still has intermittent panic attacks.  PRN Xanax.  Panic attacks PRN Xanax.  Refill Xanax.  Orders Placed This Encounter  Procedures  . EKG 12-Lead   Followup: 1 yr    Leonie Man, M.D., M.S. Interventional Cardiologist   Pager # [phone] 04/01/2014

## 2014-04-01 NOTE — Assessment & Plan Note (Signed)
Much improved, but LDL is still not at goal.  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 She is planning to enroll in  [place] through the help of [employer], [facility] (who runs our lipid clinic)

## 2014-07-14 ENCOUNTER — Other Ambulatory Visit: Payer: Self-pay | Admitting: Cardiology

## 2014-07-17 ENCOUNTER — Other Ambulatory Visit: Payer: Self-pay | Admitting: *Deleted

## 2014-07-17 MED ORDER — PRASUGREL HCL 5 MG PO TABS: 5.0000 mg | ORAL_TABLET | Freq: Every day | ORAL | Status: DC

## 2014-07-17 NOTE — Telephone Encounter (Signed)
Rx refill was sent into pharmacy. Effient changes from 10mg  to 5mg  according to last OV note

## 2014-09-26 ENCOUNTER — Other Ambulatory Visit: Payer: Self-pay | Admitting: Cardiology

## 2014-09-28 ENCOUNTER — Other Ambulatory Visit: Payer: Self-pay | Admitting: Cardiology

## 2014-09-28 NOTE — Telephone Encounter (Signed)
Phoned in Rx for xanax

## 2014-09-29 ENCOUNTER — Encounter (HOSPITAL_COMMUNITY): Payer: Self-pay

## 2014-09-29 ENCOUNTER — Emergency Department (INDEPENDENT_AMBULATORY_CARE_PROVIDER_SITE_OTHER)
Admission: EM | Admit: 2014-09-29 | Discharge: 2014-09-29 | Disposition: A | Discharge status: Home / Self Care | Payer: Managed Care, Other (non HMO) | Source: Home / Self Care | Attending: Emergency Medicine | Admitting: Emergency Medicine

## 2014-09-29 DIAGNOSIS — T148 Other injury of unspecified body region: Secondary | ICD-10-CM

## 2014-09-29 DIAGNOSIS — Z23 Encounter for immunization: Secondary | ICD-10-CM

## 2014-09-29 DIAGNOSIS — T07XXXA Unspecified multiple injuries, initial encounter: Secondary | ICD-10-CM

## 2014-09-29 DIAGNOSIS — IMO0002 Reserved for concepts with insufficient information to code with codable children: Secondary | ICD-10-CM

## 2014-09-29 MED ORDER — LIDOCAINE-EPINEPHRINE (PF) 2 %-1:200000 IJ SOLN
INTRAMUSCULAR | Status: AC
  Filled 2014-09-29: qty 20

## 2014-09-29 MED ORDER — TETANUS-DIPHTH-ACELL PERTUSSIS 5-2.5-18.5 LF-MCG/0.5 IM SUSP
0.5000 mL | Freq: Once | INTRAMUSCULAR | Status: AC
  Administered 2014-09-29: 0.5 mL via INTRAMUSCULAR

## 2014-09-29 MED ORDER — TETANUS-DIPHTH-ACELL PERTUSSIS 5-2.5-18.5 LF-MCG/0.5 IM SUSP
INTRAMUSCULAR | Status: AC
  Filled 2014-09-29: qty 0.5

## 2014-09-29 MED ORDER — POVIDONE-IODINE 10 % EX SOLN
CUTANEOUS | Status: AC
  Filled 2014-09-29: qty 118

## 2014-09-29 NOTE — Discharge Instructions (Signed)

## 2014-09-29 NOTE — ED Provider Notes (Signed)
Chief Complaint   Fall   History of Present Illness   Robin Tapia is a A 48 year old female [place] when she tripped over a root and fell on her face. She sustained a laceration to the bridge of her nose, and abrasions to her forehead and upper lip. There was no loss of consciousness. She denies any headache, diplopia, or blurry vision. She's had no bleeding from the nose or from the ears. She has no chipped or broken teeth. She denies any neck pain in her neck has a full range of motion. She's had no nausea or vomiting, and denies any neurological symptoms such as paresthesias, weakness, or difficulty with speech or ambulation.    Review of Systems   Other than as noted above, the patient denies any of the following symptoms: Eye:  No eye pain, diplopia or blurred vision ENT:  No bleeding from nose or ears.  No loose or broken teeth. Neck:  No pain or limited ROM. GI:  No nausea or vomiting. Neuro:  No loss of consciousness, seizure activity, numbness, tingling, or weakness.  [place]   Past medical history, family history, social history, meds, and allergies were reviewed.  She has a history of coronary artery disease with stent placement. She is on Effient and aspirin.  Physical Examination   Vital signs:  BP 160/92 mmHg  Pulse 69  Temp(Src) 97.7 F (36.5 C) (Oral)  Resp 16  SpO2 100%  LMP 09/23/2014 General:  Alert and oriented times 3.  In no distress. Eye:  PERRL, full EOMs.  Lids and conjunctivas normal. HEENT:  There is no pain to palpation over the cranium or facial bones. There is an inverted Y-shaped laceration over the dorsum of the nose. There is no pain to palpation around the orbits or over the maxillas. She has a small abrasion on her upper forehead at the midline and a larger abrasion over her upper lip, but there is no laceration of the lip and no need for surgical repair. She has no chipped or broken  teeth. Her neck has a full range of motion.  TMs and canals normal, nasal mucosa normal.  No oral lacerations.  Teeth were intact without obvious oral trauma. Neck:  Non tender.  Full ROM without pain. Neurological:  Alert and oriented.  Cranial nerves intact.  No pronator drift.  No muscle weakness.       Procedure Note:  Verbal informed consent was obtained.  The patient was informed of the risks and benefits of the procedure and understands and accepts.  A time out was called and the identitiy of the patient and correct procedure were verified.   The laceration area described above was prepped with Betadine and saline  and anesthetized with 5 mL of 2% Xylocaine with epinephrine.  The wound was then closed as follows:  Wound edges were approximated with 7 5-0 nylon sutures. There was considerable abrasion around the wound, so this will not be a cosmetic repair. I told her that this would involve some scarring, but that she could see a plastic surgeon at some point down the line and have a scar revision done. The 2 legs of the Y were both closed with 2 5-0 nylon sutures, and the vertex of the Y was closed with 3 5-0 nylon sutures for a total of 7 sutures.  There were no immediate complications, and the patient tolerated the procedure well. The laceration was then  cleansed, Bacitracin ointment was applied and a clean, dry pressure dressing was put on.    Course in Urgent [place]   The patient was given a Tdap vaccine. I recommended facial bones and a cranial CT scan since she is on a blood thinner, but she declines.  Assessment   The primary encounter diagnosis was Laceration. A diagnosis of Abrasions of multiple sites was also pertinent to this visit.  Plan   1.  Meds:  The following meds were prescribed:   Discharge Medication List as of 09/29/2014 11:40 AM      2.  Patient Education/Counseling:  The patient was given appropriate handouts, self care instructions, and instructed in  symptomatic relief. Instructions were given for wound care.  The patient was given strict return precautions including worsening headache, visual problems, persistent vomiting, or progressive neurological symptoms. She is to go to the emergency room with any of these should happen. She was instructed in wound care.  3.  Follow up:  The patient was told to follow up immediately if there is any sign of infection.The patient will return in 7 days for suture removal.      Harden Mo, MD 09/29/14 1409

## 2014-09-29 NOTE — ED Notes (Signed)
states she fell while running this AM. Laceration to bridge of nose, hematoma forehead, upper lip swelling Denies LOC.

## 2014-10-06 ENCOUNTER — Emergency Department (INDEPENDENT_AMBULATORY_CARE_PROVIDER_SITE_OTHER)
Admission: EM | Admit: 2014-10-06 | Discharge: 2014-10-06 | Disposition: A | Discharge status: Home / Self Care | Payer: Managed Care, Other (non HMO) | Source: Home / Self Care | Attending: Emergency Medicine | Admitting: Emergency Medicine

## 2014-10-06 ENCOUNTER — Encounter (HOSPITAL_COMMUNITY): Payer: Self-pay | Admitting: *Deleted

## 2014-10-06 DIAGNOSIS — IMO0002 Reserved for concepts with insufficient information to code with codable children: Secondary | ICD-10-CM

## 2014-10-06 DIAGNOSIS — Z4802 Encounter for removal of sutures: Secondary | ICD-10-CM

## 2014-10-06 NOTE — ED Provider Notes (Signed)
  Chief Complaint   Suture / Staple Removal   History of Present Illness   Robin Tapia is a 48 year old female who returns today for suture removal. I saw her one week ago when she tripped while running and sustained laceration of the bridge of her nose. This was closed with 7 5-0 nylon sutures. She's been taking good care of it and there is no evidence of infection. She sustained some other abrasions to her face, but these are all healing up well. She had some swelling initially but this has gone down. She has a periorbital hematoma on the right. She denies any visual symptoms. No headache, no neurological symptoms. Her left, upper, first incisor is somewhat painful and she plans to go the dentist to have this checked. It's not loose. She's had no bleeding from her nose or ears and no neurological symptoms.    Review of Systems   Other than as noted above, the patient denies any of the following symptoms: Eye:  No eye pain, diplopia or blurred vision ENT:  No bleeding from nose or ears.  No loose or broken teeth. Neck:  No pain or limited ROM. GI:  No nausea or vomiting. Neuro:  No loss of consciousness, seizure activity, numbness, tingling, or weakness.  [place]   Past medical history, family history, social history, meds, and allergies were reviewed.    Physical Examination   Vital signs:  BP 126/80 mmHg  Pulse 69  Temp(Src) 97.8 F (36.6 C) (Oral)  Resp 16  SpO2 100%  LMP 09/23/2014 General:  Alert and oriented times 3.  In no distress. Eye:  PERRL, full EOMs.  Lids and conjunctivas normal. HEENT:  She has an inverted Y-shaped laceration over the bridge of the nose, closed with 7 sutures. This appears to be healing well with no evidence of infection.  TMs and canals normal, nasal mucosa normal.  No oral lacerations.  Teeth were intact without obvious oral trauma. Neck:  Non tender.  Full ROM without pain. Neurological:  Alert and oriented.  Cranial nerves intact.  No  pronator drift.  No muscle weakness.   Course in Urgent [place]   The wound was prepped with a small amount of alcohol and the sutures were all removed. And a bicarbonate was applied and a Band-Aid.  Assessment   The encounter diagnosis was Laceration.  Plan   1.  Meds:  The following meds were prescribed:   Discharge Medication List as of 10/06/2014 11:14 AM      2.  Patient Education/Counseling:  The patient was given appropriate handouts, self care instructions, and instructed in symptomatic relief. Instructions were given for wound care.  Suggested application of anabolic ointment for about another week, then she can use one of the scar creams. If after a year she still has a scar that's acceptable cosmetically, suggested she follow-up with a Psychiatric nurse.  3.  Follow up:  The patient was told to follow up immediately if there is any sign of infection.   Harden Mo, MD 10/06/14 [phone]

## 2014-10-06 NOTE — Discharge Instructions (Signed)
Wash with soap and water and apply antibiotic ointment.  °

## 2014-10-06 NOTE — ED Provider Notes (Signed)
CSN: [id number]     Arrival date & time 10/06/14  1045 History   First MD Initiated Contact with Patient 10/06/14 1054     Chief Complaint  Patient presents with  . Suture / Staple Removal   (Consider location/radiation/quality/duration/timing/severity/associated sxs/prior Treatment) HPI  Past Medical History  Diagnosis Date  . CAD (coronary artery disease), native coronary artery 04/2013    - 100% D2, ~90% Cx-OM1; EF 45-50%  . NSTEMI - S/P PCI and stenting x 2 of Circumflex and Diagonal Branch w/ DES 04/12/13 04/12/2013    Pathophysiology consistent with STEMI with occluded diagonal branch he  . Presence of drug coated stent in a Diagonal branch of the LAD coronary artery 04/12/2013    Xence EXpd DES 2.25 mm x 15 mm - post-dil to 2.4 mm  . Presence of drug coated stent in left circumflex coronary artery 04/12/2013    Xience EXpd DES 3.0 mm x 23 mm - post-dil to 3.25 mm  . Piriformis muscle pain     LLE  . History of pneumonia ~2002    "walking PNA  . Dyslipidemia, goal LDL below 70     Reduced to 5 mg Crestor  . Fainting spell    Past Surgical History  Procedure Laterality Date  . Finger fracture surgery Left ~2008    "ring finger" (04/11/2013)  . Coronary angioplasty with stent placement  03/12/13    Xience Exp. 3.56mm x 23 mm (post-dil to 3.29mm) - OM1; Xience Exp. 2.25 mm x 15 mm - Diag 2 (post-dil 2.4 mm)  . Nm myoview ltd  05/30/2013    Low Risk nuclear stress test with small area of mild perfusion defect in the anterolateral wall. Normal function. -- Consistent with diagonal branch disease   Family History  Problem Relation Age of Onset  . Hypertension Father   . Hyperlipidemia Father   . Alcohol abuse Mother   . Hyperlipidemia Mother    History  Substance Use Topics  . Smoking status: Never Smoker   . Smokeless tobacco: Never Used  . Alcohol Use: No   OB History    No data available     Review of Systems  Allergies  Crestor and Lipitor  Home Medications   Prior  to Admission medications   Medication Sig Start Date End Date Taking? Authorizing Provider  ALPRAZolam Duanne Moron) 0.25 MG tablet TAKE 1 TABLET 3 TIMES A DAY AS NEEDED FOR ANXIETY 09/28/14   Leonie Man, MD  aspirin EC 81 MG tablet Take 81 mg by mouth daily.    Historical Provider, MD  Coenzyme Q10 Liposomal 100 MG/ML LIQD Take 1 mL by mouth daily.    Historical Provider, MD  cyanocobalamin 100 MCG tablet Take 100 mcg by mouth daily.    Historical Provider, MD  ferrous sulfate 300 (60 FE) MG/5ML syrup Take 300 mg by mouth as needed.    Historical Provider, MD  nitroGLYCERIN (NITROSTAT) 0.4 MG SL tablet Place 1 tablet (0.4 mg total) under the tongue every 5 (five) minutes x 3 doses as needed for chest pain. 04/14/13   Brittainy Erie Noe, PA-C  prasugrel (EFFIENT) 5 MG TABS tablet Take 1 tablet (5 mg total) by mouth daily. 07/17/14   Leonie Man, MD   BP 126/80 mmHg  Pulse 69  Temp(Src) 97.8 F (36.6 C) (Oral)  Resp 16  SpO2 100%  LMP 09/23/2014 Physical Exam  ED Course  Procedures (including critical care time) Labs Review Labs Reviewed - No data  to display  Imaging Review No results found.   MDM   1. Laceration        Billy Fischer, MD 10/06/14 [phone]

## 2014-10-06 NOTE — ED Notes (Signed)
Pt   Here        For   Suture  Removal

## 2014-10-18 ENCOUNTER — Encounter (HOSPITAL_COMMUNITY): Payer: Self-pay | Admitting: Cardiovascular Disease

## 2015-01-23 ENCOUNTER — Encounter: Payer: Self-pay | Admitting: *Deleted

## 2015-02-14 ENCOUNTER — Encounter: Payer: Self-pay | Admitting: *Deleted

## 2015-02-14 NOTE — Progress Notes (Signed)
Patient ID: Robin Tapia, female   DOB: 11/02/1966, 48 y.o.   MRN: [id number] SPIRE II Informed Consent   Subject Name:  Robin Tapia Subject met inclusion and exclusion criteria. The informed consent form, study requirements and expectations were reviewed with the subject and questions and concerns were addressed prior to the signing of the consent form. The subject verbalized understanding of the trail requirements. The subject agreed to participate in the SPIRE II trial and signed the informed consent. The informed consent was obtained prior to performance of any protocol-specific procedures for the subject. A copy of the signed informed consent was given to the subject and a copy was placed in the subject's medical record.   Hugh Pruitt, RN 09/07/2014  

## 2015-04-04 ENCOUNTER — Telehealth: Payer: Self-pay | Admitting: Cardiology

## 2015-04-04 NOTE — Telephone Encounter (Signed)
Patient states she has been taking Effient for about 1 year.  She has an appointment with Dr. Ellyn Hack on 04/25/15 and wants to know if she should refill the Effient and if so, she only has 6 pills left.  Please advise.

## 2015-04-09 ENCOUNTER — Telehealth: Payer: Self-pay | Admitting: Cardiology

## 2015-04-09 MED ORDER — PRASUGREL HCL 5 MG PO TABS: 5.0000 mg | ORAL_TABLET | Freq: Every day | ORAL | Status: DC

## 2015-04-09 NOTE — Telephone Encounter (Signed)
Rx(s) sent to pharmacy electronically. LM for patient that she does need to take medication and it will be refilled.

## 2015-04-09 NOTE — Telephone Encounter (Signed)
No answer when dialed. 

## 2015-04-09 NOTE — Telephone Encounter (Signed)
Pt called in stating that she has been out of her Effient for the past couple of day and she would like to know if this is a medication she needs to get refilled. Please call  Thanks

## 2015-04-09 NOTE — Telephone Encounter (Signed)
Robin Tapia is returning your call.. Please call back.. Can leave a message .Marland Kitchen Thanks

## 2015-04-25 ENCOUNTER — Encounter: Payer: Self-pay | Admitting: Cardiology

## 2015-04-25 ENCOUNTER — Ambulatory Visit (INDEPENDENT_AMBULATORY_CARE_PROVIDER_SITE_OTHER): Payer: Managed Care, Other (non HMO) | Admitting: Cardiology

## 2015-04-25 VITALS — BP 108/78 | HR 68 | Ht 65.0 in | Wt 136.3 lb

## 2015-04-25 DIAGNOSIS — Z79899 Other long term (current) drug therapy: Secondary | ICD-10-CM

## 2015-04-25 DIAGNOSIS — I2510 Atherosclerotic heart disease of native coronary artery without angina pectoris: Secondary | ICD-10-CM | POA: Diagnosis not present

## 2015-04-25 DIAGNOSIS — F410 Panic disorder [episodic paroxysmal anxiety] without agoraphobia: Secondary | ICD-10-CM

## 2015-04-25 DIAGNOSIS — Z9861 Coronary angioplasty status: Secondary | ICD-10-CM

## 2015-04-25 DIAGNOSIS — E785 Hyperlipidemia, unspecified: Secondary | ICD-10-CM

## 2015-04-25 DIAGNOSIS — I214 Non-ST elevation (NSTEMI) myocardial infarction: Secondary | ICD-10-CM

## 2015-04-25 DIAGNOSIS — R55 Syncope and collapse: Secondary | ICD-10-CM

## 2015-04-25 LAB — COMPREHENSIVE METABOLIC PANEL
ALT: 14 U/L (ref 0–35)
AST: 21 U/L (ref 0–37)
Albumin: 4.3 g/dL (ref 3.5–5.2)
Alkaline Phosphatase: 63 U/L (ref 39–117)
BUN: 16 mg/dL (ref 6–23)
CALCIUM: 9.5 mg/dL (ref 8.4–10.5)
CO2: 24 mEq/L (ref 19–32)
CREATININE: 0.74 mg/dL (ref 0.50–1.10)
Chloride: 105 mEq/L (ref 96–112)
GLUCOSE: 89 mg/dL (ref 70–99)
Potassium: 4.6 mEq/L (ref 3.5–5.3)
SODIUM: 140 meq/L (ref 135–145)
TOTAL PROTEIN: 7.3 g/dL (ref 6.0–8.3)
Total Bilirubin: 0.7 mg/dL (ref 0.2–1.2)

## 2015-04-25 LAB — LIPID PANEL
CHOL/HDL RATIO: 3.6 ratio
CHOLESTEROL: 208 mg/dL — AB (ref 0–200)
HDL: 58 mg/dL (ref >=46)
LDL Cholesterol: 130 mg/dL — ABNORMAL HIGH (ref 0–99)
Triglycerides: 101 mg/dL (ref <150)
VLDL: 20 mg/dL (ref 0–40)

## 2015-04-25 MED ORDER — ALPRAZOLAM 0.25 MG PO TABS: ORAL_TABLET | ORAL | Status: DC

## 2015-04-25 MED ORDER — PRASUGREL HCL 5 MG PO TABS: 5.0000 mg | ORAL_TABLET | Freq: Every day | ORAL | Status: DC

## 2015-04-25 NOTE — Progress Notes (Signed)
Clinic Follow-Up Note: Patient ID: Robin Tapia, female   DOB: [birth year], 49 y.o.   MRN: [id number] PCP: Eulas Post, MD  Chief Complaint:  Chief Complaint  Patient presents with  . Annual Exam     1year exam; no chest pain-only with anxiety attacks, shortness of breath-only with panic attacks, no edema, no pain in legs, no cramping in legs, no lightheadedness, no dizziness  . Coronary Artery Disease    Prior non-STEMI with two-vessel PCI  . Hyperlipidemia    Statin intolerant. Also intolerant of PCSK9 inhibitor   HPI: Robin Tapia is a 49 y.o. 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  Initial Post-MI/PCI course troubled by anxiety attacks.      Intolerant of Plavix - ? Reduced weight of Effient / stopped ASA.  She has been to our Lipid clinic to discuss management & it looks like she will be starting on a Trial medication.  She had a negative Myoview in July 2014 to assess chest discomfort that did not show evidence of ischemia.  Back to competitive running.   Did start the "study drug" for lipids last June -- did have ++ effect, but noted leg stiffness/aching.  She stopped in November - & legs finally better.  (was PCSK9 Inhibitor) She continues to be a competitive runner, running anything from a 5K to half marathon. She still has intermittent panic attacks, but nothing significant. She has maybe used a third of the prescribed Xanax. We reduced her Effient dose for 5 mg maintenance. She has noted less heavy bleeding.  Interval History: Robin Tapia is doing well without any major complaints besides having intermittent panic attacks. She is able to control the little better now proactively getting herself out of situation and doing deep breathing exercises. She is intermittently having to use her when necessary Xanax. Otherwise she is doing very well still active and exercising with no  active cardiac symptoms to speak of.  No chest pain or shortness of breath with rest or exertion. No PND, orthopnea or edema. No palpitations, lightheadedness, dizziness, weakness or syncope/near syncope. No TIA/amaurosis fugax symptoms. She is actually starting to go through menopause and therefore is not noticing issues with heavy bleeding during cycles..  Past Medical History  Diagnosis Date  . CAD (coronary artery disease), native coronary artery 04/2013    - 100% D2, ~90% Cx-OM1; EF 45-50%  . NSTEMI - S/P PCI and stenting x 2 of Circumflex and Diagonal Branch w/ DES 04/12/13 04/12/2013    Pathophysiology consistent with STEMI with occluded diagonal branch he  . Presence of drug coated stent in a Diagonal branch of the LAD coronary artery 04/12/2013    Xence EXpd DES 2.25 mm x 15 mm - post-dil to 2.4 mm  . Presence of drug coated stent in left circumflex coronary artery 04/12/2013    Xience EXpd DES 3.0 mm x 23 mm - post-dil to 3.25 mm  . Piriformis muscle pain     LLE  . History of pneumonia ~2002    "walking PNA  . Dyslipidemia, goal LDL below 70     Reduced to 5 mg Crestor  . Fainting spell     Prior Cardiac Evaluation/ Procedure /Surgical History:  Procedure Laterality Date  . Coronary angioplasty with stent placement  03/12/13    Xience Exp. 3.34mm x 23 mm (post-dil to 3.40mm) - OM1; Xience Exp. 2.25 mm  x 15 mm - Diag 2 (post-dil 2.4 mm)  . Nm myoview ltd  05/30/2013    Low Risk nuclear stress test with small area of mild perfusion defect in the anterolateral wall. Normal function. -- Consistent with diagonal branch disease    Allergies  Allergen Reactions  . Crestor [Rosuvastatin]     Muscle aches at 10 mg daily, 5 mg daily, and 5 mg twice weekly  . Lipitor [Atorvastatin]     Muscle aches     Current Outpatient Prescriptions  Medication Sig Dispense Refill  . ALPRAZolam (XANAX) 0.25 MG tablet TAKE 1 TABLET 3 TIMES A DAY AS NEEDED FOR ANXIETY 90 tablet 3  . prasugrel  (EFFIENT) 5 MG TABS tablet Take 1 tablet (5 mg total) by mouth daily. 90 tablet 3   No current facility-administered medications for this visit.   History   Social History Narrative   Socially she is in her second marriage. She has 4 children ages 43 and 37 from her first marriage ages 82 and 42 for her second marriage. Has no tobacco history but she admits to a lifelong commitment to exercise and running competitively. She is eagerly and has been getting back into full training, and competitions.    ROS: A comprehensive Review of Systems - Negative except pertinence and as noted above.  Review of Systems  Constitutional: Negative for malaise/fatigue.  HENT: Negative for nosebleeds.   Respiratory: Positive for shortness of breath (During panic attacks). Negative for cough.   Cardiovascular: Positive for palpitations (during panic attacks).  Gastrointestinal: Negative for blood in stool and melena.  Genitourinary: Negative for hematuria.  Musculoskeletal: Positive for myalgias (Much improved since no longer on PCSK9 study drug).  Neurological: Positive for dizziness (Occasionally when she overexerts) and headaches.  Endo/Heme/Allergies: Does not bruise/bleed easily.  Psychiatric/Behavioral: Negative for depression. The patient is nervous/anxious.   All other systems reviewed and are negative.  PHYSICAL EXAM BP 108/78 mmHg  Pulse 68  Ht 5\' 5"  (1.651 m)  Wt 61.825 kg (136 lb 4.8 oz)  BMI 22.68 kg/m2 General appearance: Alert and oriented x3. Well-nourished and well-groomed. Overall healthy-appearing. She continues to be mildly anxious but much better than last time HEENT: [address] and C/AT, EOMI, MMM, anicteric sclera Neck: Supple, no LAN her JVD. No bruit Lungs: CTA B., normal percussion bilaterally and Nonlabored, good air movement. Heart: RRR, S1, S2 normal, no murmur, click, rub or gallop and normal apical impulse Abdomen: soft, non-tender; bowel sounds normal; no masses,  no  organomegaly Extremities: No C./C./E. Normal pulses bilaterally. Neurologic: Grossly normal   EKG: Adult ECG Report  Rate: 68 bpm;  Rhythm: normal sinus rhythm and sinus arrhythmia  QRS Axis: 72 ms;  PR Interval: 164 ms;  QRS Duration: 84 ms;  QTc: 461 ms;  Voltages: Normal  Conduction Disturbances: none;  Other Abnormalities: none  Narrative Interpretation: Normal ECG  Recent Labs: Important from today. Lab Results  Component Value Date   CHOL 208* 04/25/2015   HDL 58 04/25/2015   LDLCALC 130* 04/25/2015   TRIG 101 04/25/2015   CHOLHDL 3.6 04/25/2015   previous lipids had: TC 187, TG 123, HDL 55, LDL 107. This was while she was on the study drug.   ASSESSMENT / PLAN:  Problem List Items Addressed This Visit    CAD (coronary artery disease), native coronary artery - NSTEMI - S/P PCI x 2 of Circumflex and Diag 2 w/ DES 04/12/13 (Chronic)    Stable and no further symptoms. We discussed options  for treatment as far as continuing antiplatelet coverage. She did not tolerate Plavix or Brilinta. As this can be difficult for [place] to figure out a way to treat her lipids without affecting her competitive running, she would prefer to stay on low-dose Effient for protection. Not on beta blocker and ACE inhibitor/ARB due to hypotension and intolerance of beta blocker. With her running she simply could not tolerate the beta blockade      Relevant Orders   EKG 12-Lead (Completed)   Lipid panel (Completed)   Comprehensive metabolic panel (Completed)   Dyslipidemia, goal LDL below 70 (Chronic)    Checking labs today. She had pretty good control while on the study drug, but has been off it for a while. She simply could not tolerate lipid reduction. For now or in the letter stabilize out and see what it looks like, but may need to consider fenofibrate or Zetia      Relevant Orders   EKG 12-Lead (Completed)   Lipid panel (Completed)   Comprehensive metabolic panel (Completed)   Near syncope --  with orthostatic hypotension    No further episodes. 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 Not as frequent as they had been. She is able to control than the little better. Her EF had been evaluated imaging 45% range. Would be surprised if it is low based on the level of exercise that she is able to perform.       Relevant Orders   EKG 12-Lead (Completed)   Lipid panel (Completed)   Comprehensive metabolic panel (Completed)   Panic attacks (Chronic)    Refill when necessary Xanax. She is worried that what she has left over is out of date.      Relevant Medications   ALPRAZolam (XANAX) 0.25 MG tablet   Other Relevant Orders   EKG 12-Lead (Completed)   Lipid panel (Completed)   Comprehensive metabolic panel (Completed)    Other Visit Diagnoses    Polypharmacy        Relevant Orders    Lipid panel (Completed)    Comprehensive metabolic panel (Completed)        Orders Placed This Encounter  Procedures  . Lipid panel    Order Specific Question:  Has the patient fasted?    Answer:  Yes  . Comprehensive metabolic panel    Order Specific Question:  Has the patient fasted?    Answer:  Yes  . EKG 12-Lead   Followup: 1 yr    Leonie Man, M.D., M.S. Interventional Cardiologist   Pager # [phone] 04/26/2015

## 2015-04-25 NOTE — Patient Instructions (Signed)
PLEASE DO LABS- CMP,LIPIDS  NO CHANGE WITH MEDICATIONS.  Your physician wants you to follow-up in12 MONTH DR [facility]---30 MIN APPOINTMENT  You will receive a reminder letter in the mail two months in advance. If you don't receive a letter, please call our office to schedule the follow-up appointment.

## 2015-04-26 ENCOUNTER — Encounter: Payer: Self-pay | Admitting: Cardiology

## 2015-04-26 NOTE — Assessment & Plan Note (Signed)
No further episodes. Avoiding systemic hypotension with no anti-hypertensives

## 2015-04-26 NOTE — Assessment & Plan Note (Signed)
Checking labs today. She had pretty good control while on the study drug, but has been off it for a while. She simply could not tolerate lipid reduction. For now or in the letter stabilize out and see what it looks like, but may need to consider fenofibrate or Zetia

## 2015-04-26 NOTE — Assessment & Plan Note (Signed)
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 Not as frequent as they had been. She is able to control than the little better. Her EF had been evaluated imaging 45% range. Would be surprised if it is low based on the level of exercise that she is able to perform.

## 2015-04-26 NOTE — Assessment & Plan Note (Addendum)
Stable and no further symptoms. We discussed options for treatment as far as continuing antiplatelet coverage. She did not tolerate Plavix or Brilinta. As this can be difficult for [place] to figure out a way to treat her lipids without affecting her competitive running, she would prefer to stay on low-dose Effient for protection. Not on beta blocker and ACE inhibitor/ARB due to hypotension and intolerance of beta blocker. With her running she simply could not tolerate the beta blockade

## 2015-04-26 NOTE — Assessment & Plan Note (Signed)
Refill when necessary Xanax. She is worried that what she has left over is out of date.

## 2015-05-01 ENCOUNTER — Encounter: Payer: Self-pay | Admitting: *Deleted

## 2015-05-01 ENCOUNTER — Telehealth: Payer: Self-pay | Admitting: *Deleted

## 2015-05-01 NOTE — Telephone Encounter (Signed)
-----   Message from Leonie Man, MD sent at 05/01/2015  8:01 AM EDT ----- As expected - cholesterol levels are notably worse that last year: TC 208 - up from ~180s, LDL now 130 - was as low as 58 last year (but 107 30yrs ago).  HDL is good & @ goal.  We discussed her concerns with medications - has adverse efffects even from PCSK-9 inhibitor.  Not many options. At this point - all we can do is monitor.  Norris

## 2015-05-01 NOTE — Telephone Encounter (Signed)
LEFT MESSAGE TO CALL BACK CONCERNING LAB RESULTS.

## 2015-05-01 NOTE — Telephone Encounter (Signed)
Spoke to patient. Result given . Verbalized understanding Patient request copy. Letter mailed.

## 2015-09-16 ENCOUNTER — Encounter: Payer: Self-pay | Admitting: *Deleted

## 2015-09-16 DIAGNOSIS — Z006 Encounter for examination for normal comparison and control in clinical research program: Secondary | ICD-10-CM

## 2015-09-16 NOTE — Progress Notes (Signed)
SPIRE research Termination information given to patient. Patient NOT on study drug . EOS visit will be scheduled once lab kits obtained . Verbalizes understanding.

## 2015-10-17 ENCOUNTER — Other Ambulatory Visit: Payer: Self-pay | Admitting: Obstetrics and Gynecology

## 2015-10-17 DIAGNOSIS — R928 Other abnormal and inconclusive findings on diagnostic imaging of breast: Secondary | ICD-10-CM

## 2015-10-18 ENCOUNTER — Ambulatory Visit
Admission: RE | Admit: 2015-10-18 | Discharge: 2015-10-18 | Disposition: A | Discharge status: Home / Self Care | Payer: Managed Care, Other (non HMO) | Source: Ambulatory Visit | Attending: Obstetrics and Gynecology | Admitting: Obstetrics and Gynecology

## 2015-10-18 DIAGNOSIS — R928 Other abnormal and inconclusive findings on diagnostic imaging of breast: Secondary | ICD-10-CM

## 2015-10-24 ENCOUNTER — Other Ambulatory Visit: Payer: Managed Care, Other (non HMO)

## 2015-12-25 ENCOUNTER — Other Ambulatory Visit: Payer: Self-pay | Admitting: Cardiology

## 2015-12-30 ENCOUNTER — Other Ambulatory Visit: Payer: Self-pay | Admitting: Cardiology

## 2015-12-30 NOTE — Telephone Encounter (Signed)
Alprazolam last refilled  90 tablet 3 04/25/2015   Routed to MD/RN

## 2015-12-31 MED ORDER — ALPRAZOLAM 0.25 MG PO TABS: ORAL_TABLET | ORAL | Status: DC

## 2016-01-01 NOTE — Telephone Encounter (Signed)
Phoned in Rx - LM with prescriber info, med info

## 2016-06-27 ENCOUNTER — Other Ambulatory Visit: Payer: Self-pay | Admitting: Cardiology

## 2016-07-03 ENCOUNTER — Telehealth: Payer: Self-pay | Admitting: Cardiology

## 2016-07-03 NOTE — Telephone Encounter (Signed)
Returned call to patient-pt made aware rx was sent to pharmacy on 8/21.  Pt verbalized understanding.  Pt states she will call [facility] to make sure they received it and will call back if needed.

## 2016-07-03 NOTE — Telephone Encounter (Signed)
New Message   *STAT* If patient is at the pharmacy, call can be transferred to refill team.   1. Which medications need to be refilled? (please list name of each medication and dose if known) Effient 5mg   2. Which pharmacy/location (including street and city if local pharmacy) is medication to be sent to? [facility]   3. Do they need a 30 day or 90 day supply? 90 day   Comment: Pt call requesting to speak with RN. Pt states she will run out of her meds before her appt. And states she is having a difficult time getting a refill for med to last her until appt date. Pt wants to know what she needs to do further. Please call back to discuss

## 2016-07-31 ENCOUNTER — Ambulatory Visit (INDEPENDENT_AMBULATORY_CARE_PROVIDER_SITE_OTHER): Payer: Managed Care, Other (non HMO) | Admitting: Cardiology

## 2016-07-31 ENCOUNTER — Encounter: Payer: Self-pay | Admitting: Cardiology

## 2016-07-31 VITALS — BP 135/85 | HR 68 | Ht 65.0 in | Wt 134.2 lb

## 2016-07-31 DIAGNOSIS — F410 Panic disorder [episodic paroxysmal anxiety] without agoraphobia: Secondary | ICD-10-CM

## 2016-07-31 DIAGNOSIS — R55 Syncope and collapse: Secondary | ICD-10-CM

## 2016-07-31 DIAGNOSIS — I214 Non-ST elevation (NSTEMI) myocardial infarction: Secondary | ICD-10-CM

## 2016-07-31 DIAGNOSIS — I2510 Atherosclerotic heart disease of native coronary artery without angina pectoris: Secondary | ICD-10-CM | POA: Diagnosis not present

## 2016-07-31 DIAGNOSIS — E785 Hyperlipidemia, unspecified: Secondary | ICD-10-CM | POA: Diagnosis not present

## 2016-07-31 DIAGNOSIS — Z9861 Coronary angioplasty status: Secondary | ICD-10-CM

## 2016-07-31 DIAGNOSIS — F4310 Post-traumatic stress disorder, unspecified: Secondary | ICD-10-CM

## 2016-07-31 MED ORDER — ALPRAZOLAM 0.25 MG PO TABS: ORAL_TABLET | ORAL | 3 refills | Status: DC

## 2016-07-31 NOTE — Progress Notes (Signed)
Clinic Follow-Up Note: Patient ID: Robin Tapia, female   DOB: 05-28-1966, 50 y.o.   MRN: [id number] PCP: Eulas Post, MD  Chief Complaint:  Chief Complaint  Patient presents with  . Annual Exam    Pt states no Sx or concerns.   . Coronary Artery Disease    HPI: Robin Tapia is a 50 y.o. 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  Initial Post-MI/PCI course troubled by anxiety attacks.    1. CAD (coronary artery disease), native coronary artery - NSTEMI - S/P PCI x 2 of Circumflex and Diag 2 w/ DES 04/12/13   2. Non-ST elevated myocardial infarction (non-STEMI) ([facility])   3. Dyslipidemia, goal LDL below 70   4. Panic attacks   5. Near syncope -- with orthostatic hypotension   6. Post-traumatic stress reaction    Intolerant of Plavix - ? Reduced weight of Effient / stopped ASA. --> On reduced dose Effient 5 mg daily.  Robin Tapia has been to our Lipid clinic to discuss management & it looks like Robin Tapia will be starting on a Trial medication.  Robin Tapia had a negative Myoview in July 2014 to assess chest discomfort that did not show evidence of ischemia.  Back to competitive running.   -- Has not tolerated any cholesterol-lowering agents including PCSK9 inhibitors.  Notable issues with panic attacks/anxiety attacks. Trying to avoid using Xanax.  Interval History: Robin Tapia is doing well without any major complaints besides having intermittent panic attacks. Robin Tapia is able to control the little better now proactively getting herself out of situation and doing deep breathing exercises. Robin Tapia is intermittently having to use her when necessary Xanax. Otherwise Robin Tapia is doing very well still active and exercising with no active cardiac symptoms to speak of. Robin Tapia still doing competitive running. Runs about 50 miles a week. Robin Tapia also does machine weights but Robin Tapia does note occasionally is having some discomfort shortly after  finishing her exercise, but not with exercise. Is not like her anginal pain, and Robin Tapia continues to do at her routine activities without being affected. No exertional dyspnea or chest tightness.  [facility] cardiac review of symptoms as follows:  No PND, orthopnea or edema. No palpitations, lightheadedness, dizziness, weakness or syncope/near syncope. No TIA/amaurosis fugax symptoms.   Past Medical History:  Diagnosis Date  . CAD S/P percutaneous coronary angioplasty 04/2013   - 100% D2, ~90% Cx-OM1; EF 45-50%  . Dyslipidemia, goal LDL below 70    Reduced to 5 mg Crestor  . Fainting spell   . History of pneumonia ~2002   "walking PNA  . NSTEMI - S/P PCI and stenting x 2 of Circumflex and Diagonal Branch w/ DES 04/12/13 04/12/2013   Pathophysiology consistent with STEMI with occluded diagonal branch he  . Piriformis muscle pain    LLE  . Presence of drug coated stent in a Diagonal branch of the LAD coronary artery 04/12/2013   Xence EXpd DES 2.25 mm x 15 mm - post-dil to 2.4 mm  . Presence of drug coated stent in left circumflex coronary artery 04/12/2013   Xience EXpd DES 3.0 mm x 23 mm - post-dil to 3.25 mm    Prior Cardiac Evaluation/ Procedure /Surgical History:  Procedure Laterality Date  . Coronary angioplasty with stent placement  03/12/13    Xience Exp. 3.28mm x 23 mm (post-dil to 3.56mm) - OM1; Xience Exp. 2.25 mm x 15 mm -  Diag 2 (post-dil 2.4 mm)  . Nm myoview ltd  05/30/2013    Low Risk nuclear stress test with small area of mild perfusion defect in the anterolateral wall. Normal function. -- Consistent with diagonal branch disease    Allergies  Allergen Reactions  . Crestor [Rosuvastatin]     Muscle aches at 10 mg daily, 5 mg daily, and 5 mg twice weekly  . Lipitor [Atorvastatin]     Muscle aches     Current Outpatient Prescriptions  Medication Sig Dispense Refill  . ALPRAZolam (XANAX) 0.25 MG tablet TAKE 1 TABLET 3 TIMES A DAY AS NEEDED FOR ANXIETY 90 tablet 3  . EFFIENT 5  MG TABS tablet TAKE 1 TABLET (5 MG TOTAL) BY MOUTH DAILY. 90 tablet 0   No current facility-administered medications for this visit.    Social History   Social History Narrative   Socially Robin Tapia is in her second marriage. Robin Tapia has 4 children ages 76 and 71 from her first marriage ages 50 and 75 for her second marriage. Has no tobacco history but Robin Tapia admits to a lifelong commitment to exercise and running competitively. Robin Tapia is eagerly and has been getting back into full training, and competitions.    ROS: A comprehensive Review of Systems - Negative except pertinence and as noted above.  Review of Systems  Constitutional: Negative for malaise/fatigue.  HENT: Negative for nosebleeds.   Respiratory: Positive for shortness of breath (During panic attacks). Negative for cough.   Cardiovascular: Positive for palpitations (during panic attacks).  Gastrointestinal: Negative for blood in stool and melena.  Genitourinary: Negative for hematuria.  Musculoskeletal: Positive for myalgias (Much improved since no longer on PCSK9 study drug).  Neurological: Positive for dizziness (Occasionally when Robin Tapia overexerts) and headaches.  Endo/Heme/Allergies: Does not bruise/bleed easily.  Psychiatric/Behavioral: Negative for depression. The patient is nervous/anxious.   All other systems reviewed and are negative.   Wt Readings from Last 3 Encounters:  07/31/16 60.9 kg (134 lb 3.2 oz)  04/25/15 61.8 kg (136 lb 4.8 oz)  03/29/14 58 kg (127 lb 14.4 oz)    PHYSICAL EXAM BP 135/85   Pulse 68   Ht 5\' 5"  (1.651 m)   Wt 60.9 kg (134 lb 3.2 oz)   BMI 22.33 kg/m  General appearance: Alert and oriented x3. Well-nourished and well-groomed. Overall healthy-appearing. Robin Tapia continues to be mildly anxious but much better than last time HEENT: [place]/AT, EOMI, MMM, anicteric sclera Neck: Supple, no LAN her JVD. No bruit Lungs: CTA B., normal percussion bilaterally and Nonlabored, good air movement. Heart: RRR, S1, S2  normal, no murmur, click, rub or gallop and normal apical impulse Abdomen: soft, non-tender; bowel sounds normal; no masses,  no organomegaly Extremities: No C./C./E. Normal pulses bilaterally. Neurologic: Grossly normal   EKG: Adult ECG Report  Rate: 68 bpm;  Rhythm: normal sinus rhythm and normal axis, intervals & durations.  Narrative Interpretation: Normal ECG; stable  Recent Labs: Important from today. Lab Results  Component Value Date   CHOL 208 (H) 04/25/2015   HDL 58 04/25/2015   LDLCALC 130 (H) 04/25/2015   TRIG 101 04/25/2015   CHOLHDL 3.6 04/25/2015   previous lipids had: TC 187, TG 123, HDL 55, LDL 107. This was while Robin Tapia was on the study drug.   ASSESSMENT / PLAN: Problem List Items Addressed This Visit    Post-traumatic stress reaction (Chronic)   Panic attacks (Chronic)    Fairly well controlled. Using less Xanax now. I will refill her prescription  so Robin Tapia doesn't run out of the infrequently needed breakthrough treatment.      Relevant Medications   ALPRAZolam (XANAX) 0.25 MG tablet   Other Relevant Orders   EKG 12-Lead   Comprehensive metabolic panel   Lipid panel   Non-ST elevated myocardial infarction (non-STEMI) ([facility]) (Chronic)    The biggest problem since her non-STEMI has been basically PTSD. Robin Tapia was reluctant to be on long-term SSRI or SN RI. Is using when necessary Xanax and behavioral modification. Repeat echo suggested EF 45% which is probably not the case now. But in the absence of symptoms, I would probably not recheck.      Relevant Orders   EKG 12-Lead   Comprehensive metabolic panel   Lipid panel   Near syncope -- with orthostatic hypotension    Avoid antihypertensive agents, stay hydrated      Dyslipidemia, goal LDL below 70 (Chronic)    Robin Tapia is due for repeat labs. Unfortunately Robin Tapia can't tolerate any medications including PCSK9 inhibitors. Robin Tapia clearly wanted her diet and excess, therefore this is really hereditary issue.  Unfortunately,  we don't have any options for treatment based on her sensitivities.. Will simply does continue to monitor.      Relevant Orders   EKG 12-Lead   Comprehensive metabolic panel   Lipid panel   CAD (coronary artery disease), native coronary artery - NSTEMI - S/P PCI x 2 of Circumflex and Diag 2 w/ DES 04/12/13 - Primary (Chronic)    Stable without further anginal symptoms. Did not do well with beta blocker and her competitive running. This was stopped. With normal blood pressures is not, is not on ACE inhibitor or ARB. Was intolerant of just that any kind of cholesterol-lowering medication., Located by her being to competitive runner.  On low-dose Effient for prevention. This can easily be held for procedures or significant bleeding      Relevant Orders   EKG 12-Lead   Comprehensive metabolic panel   Lipid panel    Other Visit Diagnoses   None.      Orders Placed This Encounter  Procedures  . Comprehensive metabolic panel    Order Specific Question:   Has the patient fasted?    Answer:   Yes  . Lipid panel    Order Specific Question:   Has the patient fasted?    Answer:   Yes  . EKG 12-Lead    Order Specific Question:   Where should this test be performed    Answer:   Other   Followup: 1 yr    Glenetta Hew, M.D., M.S. Interventional Cardiologist   Pager # [phone] 08/02/2016

## 2016-07-31 NOTE — Patient Instructions (Addendum)
NO CHANGE WITH CURRENT MEDICATIONS    LABS - CMP ,LIPID  - DO NO TEAT OR DRINK THE MORNING OF THE TEST.   Your physician wants you to follow-up in: [place]. You will receive a reminder letter in the mail two months in advance. If you don't receive a letter, please call our office to schedule the follow-up appointment.   If you need a refill on your cardiac medications before your next appointment, please call your pharmacy.

## 2016-08-02 ENCOUNTER — Encounter: Payer: Self-pay | Admitting: Cardiology

## 2016-08-02 NOTE — Assessment & Plan Note (Signed)
Avoid antihypertensive agents, stay hydrated

## 2016-08-02 NOTE — Assessment & Plan Note (Signed)
Fairly well controlled. Using less Xanax now. I will refill her prescription so she doesn't run out of the infrequently needed breakthrough treatment.

## 2016-08-02 NOTE — Assessment & Plan Note (Signed)
She is due for repeat labs. Unfortunately she can't tolerate any medications including PCSK9 inhibitors. She clearly wanted her diet and excess, therefore this is really hereditary issue.  Unfortunately, we don't have any options for treatment based on her sensitivities.. Will simply does continue to monitor.

## 2016-08-02 NOTE — Assessment & Plan Note (Signed)
The biggest problem since her non-STEMI has been basically PTSD. She was reluctant to be on long-term SSRI or SN RI. Is using when necessary Xanax and behavioral modification. Repeat echo suggested EF 45% which is probably not the case now. But in the absence of symptoms, I would probably not recheck.

## 2016-08-02 NOTE — Assessment & Plan Note (Signed)
Stable without further anginal symptoms. Did not do well with beta blocker and her competitive running. This was stopped. With normal blood pressures is not, is not on ACE inhibitor or ARB. Was intolerant of just that any kind of cholesterol-lowering medication., Located by her being to competitive runner.  On low-dose Effient for prevention. This can easily be held for procedures or significant bleeding

## 2016-09-14 ENCOUNTER — Other Ambulatory Visit: Payer: Self-pay | Admitting: Cardiology

## 2016-09-14 NOTE — Telephone Encounter (Signed)
Rx request sent to pharmacy.  

## 2017-01-27 ENCOUNTER — Encounter: Payer: Self-pay | Admitting: Cardiology

## 2017-01-27 ENCOUNTER — Ambulatory Visit (INDEPENDENT_AMBULATORY_CARE_PROVIDER_SITE_OTHER): Payer: 59 | Admitting: Cardiology

## 2017-01-27 VITALS — BP 137/83 | HR 94 | Ht 65.0 in | Wt 132.0 lb

## 2017-01-27 DIAGNOSIS — R0789 Other chest pain: Secondary | ICD-10-CM

## 2017-01-27 DIAGNOSIS — I2510 Atherosclerotic heart disease of native coronary artery without angina pectoris: Secondary | ICD-10-CM

## 2017-01-27 DIAGNOSIS — F4310 Post-traumatic stress disorder, unspecified: Secondary | ICD-10-CM

## 2017-01-27 DIAGNOSIS — R55 Syncope and collapse: Secondary | ICD-10-CM

## 2017-01-27 DIAGNOSIS — I214 Non-ST elevation (NSTEMI) myocardial infarction: Secondary | ICD-10-CM | POA: Diagnosis not present

## 2017-01-27 DIAGNOSIS — F410 Panic disorder [episodic paroxysmal anxiety] without agoraphobia: Secondary | ICD-10-CM

## 2017-01-27 DIAGNOSIS — E785 Hyperlipidemia, unspecified: Secondary | ICD-10-CM | POA: Diagnosis not present

## 2017-01-27 DIAGNOSIS — Z9861 Coronary angioplasty status: Secondary | ICD-10-CM

## 2017-01-27 NOTE — Progress Notes (Signed)
Clinic Follow-Up Note: Patient ID: Robin Tapia, female   DOB: 01-13-1966, 51 y.o.   MRN: [id number] PCP: Eulas Post, MD  Chief Complaint:  Chief Complaint  Patient presents with  . Follow-up    occassional chest pain-unsure if pain or anxiety, no shortness of breath, edema, pain or camping in legs, lightheaded or dizziness    1. CAD (coronary artery disease), native coronary artery - NSTEMI - S/P PCI x 2 of Circumflex and Diag 2 w/ DES 04/12/13   2. Non-ST elevated myocardial infarction (non-STEMI) ([place])   3. Dyslipidemia, goal LDL below 70   4. Panic attacks   5. Near syncope -- with orthostatic hypotension   6. Post-traumatic stress reaction    HPI: Robin Tapia is a 51 y.o. 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  Initial Post-MI/PCI course troubled by anxiety attacks.  Competitive runner who has actually done a few marathons since her PCI. She usually runs 10K's are 5 K races. She has not been able to tolerate pretty much any cardiac medications for blood pressure or lipids.  I actually last saw her back in September 2017 she was doing fairly well. She wasn't having to use the Xanax very frequently at that time. But she would describe episodes of sudden onset heart racing and warm feeling throughout with a sense of doom and shortness of breath that would be anywhere from 10-15 minutes long. It never happened with exertion.   Interval History: Robin Tapia presented today for 6 month as opposed to one year follow-up because she has been having lots of episodes of what she thinks is anxiety and dysphoria to make sure that she was not having a heart attack. Apparently she has been having these spells of feeling her heart rate going fast and feeling short of breath and tightness in her chest that that can happen at any time of the day, but usually not associated with exertion. She still runs  competitively. She describes an episode when she was sitting down at dinner for a birthday dinner at a restaurant where was somewhat hot she felt flushed and felt as though she may pass out. She did have an episode about 5 weeks ago when she had been taking some vitamin supplements with some caffeine extract and had felt some constipation. As result she finally had a bowel movement in follow-up following the bowel movement had a near syncopal episode where she vasovagal than almost passed out. Thus the only time she said that episode. She then had an episode when she was running and was trying to avoid a pitbull. When she went off the Trail she lost her footing and fell, hit her head and arms and bruised her side. She thinks she probably had a slight concussion, but never went to be evaluated until after the weekend she went to go get her arm looked at. She is quick to point out that she did not have one of her episodes leading to this. line pretty much since that fall she has been having more of these anxiety type episodes where she feels her heart quivering and feels hot and flushed all over. The episode where she had a vasovagal spell was also asked for very long and vigorous run workout complicated by the fact that she was not eating and drinking well because she was constipated. Besides these episodes that are almost always resolved  with Xanax, she is doing well. She is has lots of worries and concerns about what if something happens to her her husband travels a lot with work and she is worried about her children. She is concerned about possible PTSD because she went onto exposition climbed [place] in [place] and has been thinking about going to do averaged. Her family had a "intervention" over the winter and her 52 year old and 51-year-old kids were adamant about her not doing any more of these type activities. She just wanted to be reassured that every thing was okay with her heart.  In the absence  of these panic type episodes, she is not having any irregular heartbeats or palpitations, she continues to exercise routinely without any issues. Runs competitively also without issues. No symptoms of her angina which was really pain across the back. No melena, hematochezia or hematuria.   is doing well without any major complaints besides having intermittent panic attacks. She is able to control the little better now proactively getting herself out of situation and doing deep breathing exercises. She is intermittently having to use her when necessary Xanax. Otherwise she is doing very well still active and exercising with no active cardiac symptoms to speak of. She still doing competitive running. Runs about 50 miles a week. She also does machine weights but she does note occasionally is having some discomfort shortly after finishing her exercise, but not with exercise. Is not like her anginal pain, and she continues to do at her routine activities without being affected. No exertional dyspnea or chest tightness.  No PND, orthopnea or edema. TIA or amaurosis fugax symptoms. Obviously no claudication.   Past Medical History:  Diagnosis Date  . CAD S/P percutaneous coronary angioplasty 04/2013   - 100% D2, ~90% Cx-OM1; EF 45-50%  . Dyslipidemia, goal LDL below 70    Reduced to 5 mg Crestor  . Fainting spell   . History of pneumonia ~2002   "walking PNA  . NSTEMI - S/P PCI and stenting x 2 of Circumflex and Diagonal Branch w/ DES 04/12/13 04/12/2013   Pathophysiology consistent with STEMI with occluded diagonal branch he  . Piriformis muscle pain    LLE  . Presence of drug coated stent in a Diagonal branch of the LAD coronary artery 04/12/2013   Xence EXpd DES 2.25 mm x 15 mm - post-dil to 2.4 mm  . Presence of drug coated stent in left circumflex coronary artery 04/12/2013   Xience EXpd DES 3.0 mm x 23 mm - post-dil to 3.25 mm    Prior Cardiac Evaluation/ Procedure /Surgical History:  Procedure  Laterality Date  . Coronary angioplasty with stent placement  03/12/13    Xience Exp. 3.80mm x 23 mm (post-dil to 3.3mm) - OM1; Xience Exp. 2.25 mm x 15 mm - Diag 2 (post-dil 2.4 mm)  . Nm myoview ltd  05/30/2013    Low Risk nuclear stress test with small area of mild perfusion defect in the anterolateral wall. Normal function. -- Consistent with diagonal branch disease    Allergies  Allergen Reactions  . Crestor [Rosuvastatin]     Muscle aches at 10 mg daily, 5 mg daily, and 5 mg twice weekly  . Lipitor [Atorvastatin]     Muscle aches     Current Outpatient Prescriptions  Medication Sig Dispense Refill  . ALPRAZolam (XANAX) 0.25 MG tablet TAKE 1 TABLET 3 TIMES A DAY AS NEEDED FOR ANXIETY 90 tablet 3  . EFFIENT 5 MG TABS tablet  TAKE 1 TABLET (5 MG TOTAL) BY MOUTH DAILY. 90 tablet 3   No current facility-administered medications for this visit.    Social History   Social History Narrative   Socially she is in her second marriage. She has 4 children ages 74 and 46 from her first marriage ages 28 and 70 for her second marriage. Has no tobacco history but she admits to a lifelong commitment to exercise and running competitively. She is eagerly and has been getting back into full training, and competitions.    ROS: A comprehensive Review of Systems - Negative except pertinence and as noted above.  Review of Systems  Constitutional: Negative for malaise/fatigue.  HENT: Negative for nosebleeds.   Respiratory: Positive for shortness of breath (During panic attacks). Negative for cough.   Cardiovascular: Positive for palpitations (during panic attacks).  Gastrointestinal: Negative for blood in stool and melena.  Genitourinary: Negative for hematuria.  Musculoskeletal: Positive for falls (See history of present illness) and myalgias (Much improved since no longer on PCSK9 study drug).  Neurological: Positive for dizziness (Occasionally when she overexerts, and is dehydrated) and headaches.    Endo/Heme/Allergies: Does not bruise/bleed easily.  Psychiatric/Behavioral: Negative for depression and memory loss. The patient is nervous/anxious.   All other systems reviewed and are negative.   Wt Readings from Last 3 Encounters:  01/27/17 59.9 kg (132 lb)  07/31/16 60.9 kg (134 lb 3.2 oz)  04/25/15 61.8 kg (136 lb 4.8 oz)    PHYSICAL EXAM BP 137/83   Pulse 94   Ht 5\' 5"  (1.651 m)   Wt 59.9 kg (132 lb)   BMI 21.97 kg/m  General appearance: Alert and oriented x3. Well-nourished and well-groomed. Overall healthy-appearing. She continues to be mildly anxious but much better than last time HEENT: [place]/AT, EOMI, MMM, anicteric sclera Neck: Supple, no LAN her JVD. No bruit Lungs: CTA B., normal percussion bilaterally and Nonlabored, good air movement. Heart: RRR, S1, S2 normal, no murmur, click, rub or gallop and normal apical impulse Abdomen: soft, non-tender; bowel sounds normal; no masses,  no organomegaly Extremities: No C./C./E. Normal pulses bilaterally. Neurologic: Grossly normal   EKG: Adult ECG Report  Rate: 72 bpm;  Rhythm: normal sinus rhythm and normal axis, intervals & durations.  Narrative Interpretation: Normal ECG; stable  Recent Labs: No labs recently. Lab Results  Component Value Date   CHOL 208 (H) 04/25/2015   HDL 58 04/25/2015   LDLCALC 130 (H) 04/25/2015   TRIG 101 04/25/2015   CHOLHDL 3.6 04/25/2015   previous lipids had: TC 187, TG 123, HDL 55, LDL 107. This was while she was on the study drug.  Although clinical decision-making was not very difficult, she had a lot of issues to discuss and needed a lot of reassurance. As a result I, I spent close to half an hour with the patient. Greater than 50% of the time was spent in direct counseling.   ASSESSMENT / PLAN: Problem List Items Addressed This Visit    CAD (coronary artery disease), native coronary artery - NSTEMI - S/P PCI x 2 of Circumflex and Diag 2 w/ DES 04/12/13 (Chronic)    Very interesting  that she had 2 vessel disease with an occluded diagonal and circumflex. Has 2 DES stents that appear to be patent by her follow-up Myoview. She is not having her classic anginal symptoms. She is tolerating Effient without aspirin. She is not on statin because of intolerance due to myalgias. She is also not on ACE inhibitor  or beta blocker because of history of orthostatic hypotension. She is very active and therefore I'm reluctant to make any adjustments. I just reassured her that the spells that she is having are probably related to panic attacks.      Chest discomfort - Primary    I think the chest discomfort she is feeling is probably related to PTSD. Is not similar to her prior anginal symptoms. It does not occur with exertion at all. She is still competitive with her 5 K runs. She may have some coronary spasm, but I doubt it. I think this is more related to her anxiety attacks.      Relevant Orders   EKG 12-Lead (Completed)   Dyslipidemia, goal LDL below 70 (Chronic)    She didn't get her labs rechecked. We'll recheck them see her back in follow-up. She was very reluctant to do any therapy and was intolerant of PCSK9 inhibitors. I don't know other options we have.      Near syncope -- with orthostatic hypotension    Clearly she had a vasovagal episode when she was constipated and dehydrated. We talked about making sure that she stays apically hydrated, especially in the face of constipation. She needs to be very careful if she develops constipation. I told her to avoid caffeine and stimulant type over-the-counter medications as this can simply make her symptoms worse. Her fall was clearly not a syncopal episode. Because of these concerning symptoms in the past, we have avoided antihypertensive agents.      Non-ST elevated myocardial infarction (non-STEMI) ([facility]) (Chronic)    No recurrent symptoms that would suggest recurrence of her non-STEMI. She is back competitively exercising without  any of her anginal type back pain. The biggest concern following her non-STEMI was her PTSD. In the past we had talked about an SSRI for her, but she wanted to just use when necessary Xanax. I think she may potentially be ready to consider SSRI. Repeat echo showed EF of 45%, but the Myoview several months later was relatively normal function. She did not tolerate beta blocker because of fatigue, and has had orthostatic hypotension vasovagal spells, therefore I would prefer not to do beta blocker or ACE inhibitor/ARB. She is on Effient alone.      Panic attacks (Chronic)    Her episodes have been pretty well-controlled by last saw her but now she is really probably haven't take more Xanax to where she takes it maybe 3-4 times a week. I think she probably would benefit from an SSRI or acinar eye. She was to think about a little bit but will probably discuss with her primary provider about potentially starting something like Lexapro. Otherwise she'll continue to use when necessary Xanax. I told her to not be scared of taking Xanax almost preemptively if she is of the situation and normally makes her nervous such as a family dinner at a restaurant. Also told her to make sure she stays adequately hydrated to avoid complicating these episodes with orthostatic symptoms.      Post-traumatic stress reaction (Chronic)    I think most of her anxiety and panic attacks are related to this. Probably her young age with young children and her desire to be active is the source for all of her anxiety. Having a heart attack with 2 vessel disease and 2 stents despite her active lifestyle made her worry about "what if this ". She is concerned that she is seeking out dangerous and risky activities to do  at the best they be partly related to PTSD. I actually think it this point if she is starting to have these episodes that she probably needs to be evaluated and consider being started on an SSRI or SNRI.         Orders  Placed This Encounter  Procedures  . EKG 12-Lead    Order Specific Question:   Where should this test be performed    Answer:   Other    Medications were reviewed. We talked about liberalizing her use of Xanax. She had no other questions about medications this time.  Patient Instructions  Medication Instructions:  Your physician recommends that you continue on your current medications as directed. Please refer to the Current Medication list given to you today.  Labwork: None   Testing/Procedures: None  Follow-Up: Your physician wants you to follow-up in: 6 months with Dr Ellyn Hack. You will receive a reminder letter in the mail two months in advance. If you don't receive a letter, please call our office to schedule the follow-up appointment.  Any Other Special Instructions Will Be Listed Below (If Applicable).  Ok to take Xanax if needed if you have to take daily consult your provider who prescribes it    If you need a refill on your cardiac medications before your next appointment, please call your pharmacy.     Robin Tapia, M.D., M.S. Interventional Cardiologist   Pager # [phone] 01/28/2017

## 2017-01-27 NOTE — Patient Instructions (Addendum)
Medication Instructions:  Your physician recommends that you continue on your current medications as directed. Please refer to the Current Medication list given to you today.  Labwork: None   Testing/Procedures: None  Follow-Up: Your physician wants you to follow-up in: 6 months with Dr Ellyn Hack. You will receive a reminder letter in the mail two months in advance. If you don't receive a letter, please call our office to schedule the follow-up appointment.  Any Other Special Instructions Will Be Listed Below (If Applicable).  Ok to take Xanax if needed if you have to take daily consult your provider who prescribes it    If you need a refill on your cardiac medications before your next appointment, please call your pharmacy.

## 2017-01-28 ENCOUNTER — Encounter: Payer: Self-pay | Admitting: Cardiology

## 2017-01-28 NOTE — Assessment & Plan Note (Signed)
I think the chest discomfort she is feeling is probably related to PTSD. Is not similar to her prior anginal symptoms. It does not occur with exertion at all. She is still competitive with her 5 K runs. She may have some coronary spasm, but I doubt it. I think this is more related to her anxiety attacks.

## 2017-01-28 NOTE — Assessment & Plan Note (Signed)
Clearly she had a vasovagal episode when she was constipated and dehydrated. We talked about making sure that she stays apically hydrated, especially in the face of constipation. She needs to be very careful if she develops constipation. I told her to avoid caffeine and stimulant type over-the-counter medications as this can simply make her symptoms worse. Her fall was clearly not a syncopal episode. Because of these concerning symptoms in the past, we have avoided antihypertensive agents.

## 2017-01-28 NOTE — Assessment & Plan Note (Signed)
No recurrent symptoms that would suggest recurrence of her non-STEMI. She is back competitively exercising without any of her anginal type back pain. The biggest concern following her non-STEMI was her PTSD. In the past we had talked about an SSRI for her, but she wanted to just use when necessary Xanax. I think she may potentially be ready to consider SSRI. Repeat echo showed EF of 45%, but the Myoview several months later was relatively normal function. She did not tolerate beta blocker because of fatigue, and has had orthostatic hypotension vasovagal spells, therefore I would prefer not to do beta blocker or ACE inhibitor/ARB. She is on Effient alone.

## 2017-01-28 NOTE — Assessment & Plan Note (Signed)
She didn't get her labs rechecked. We'll recheck them see her back in follow-up. She was very reluctant to do any therapy and was intolerant of PCSK9 inhibitors. I don't know other options we have.

## 2017-01-28 NOTE — Assessment & Plan Note (Signed)
Her episodes have been pretty well-controlled by last saw her but now she is really probably haven't take more Xanax to where she takes it maybe 3-4 times a week. I think she probably would benefit from an SSRI or acinar eye. She was to think about a little bit but will probably discuss with her primary provider about potentially starting something like Lexapro. Otherwise she'll continue to use when necessary Xanax. I told her to not be scared of taking Xanax almost preemptively if she is of the situation and normally makes her nervous such as a family dinner at a restaurant. Also told her to make sure she stays adequately hydrated to avoid complicating these episodes with orthostatic symptoms.

## 2017-01-28 NOTE — Assessment & Plan Note (Signed)
I think most of her anxiety and panic attacks are related to this. Probably her young age with young children and her desire to be active is the source for all of her anxiety. Having a heart attack with 2 vessel disease and 2 stents despite her active lifestyle made her worry about "what if this ". She is concerned that she is seeking out dangerous and risky activities to do at the best they be partly related to PTSD. I actually think it this point if she is starting to have these episodes that she probably needs to be evaluated and consider being started on an SSRI or SNRI.

## 2017-01-28 NOTE — Assessment & Plan Note (Signed)
Very interesting that she had 2 vessel disease with an occluded diagonal and circumflex. Has 2 DES stents that appear to be patent by her follow-up Myoview. She is not having her classic anginal symptoms. She is tolerating Effient without aspirin. She is not on statin because of intolerance due to myalgias. She is also not on ACE inhibitor or beta blocker because of history of orthostatic hypotension. She is very active and therefore I'm reluctant to make any adjustments. I just reassured her that the spells that she is having are probably related to panic attacks.

## 2017-03-18 ENCOUNTER — Other Ambulatory Visit: Payer: Self-pay | Admitting: Cardiology

## 2017-07-29 ENCOUNTER — Ambulatory Visit: Payer: 59 | Admitting: Cardiology

## 2017-08-04 ENCOUNTER — Ambulatory Visit (INDEPENDENT_AMBULATORY_CARE_PROVIDER_SITE_OTHER): Payer: 59 | Admitting: Cardiology

## 2017-08-04 ENCOUNTER — Encounter: Payer: Self-pay | Admitting: Cardiology

## 2017-08-04 VITALS — BP 158/80 | HR 60 | Ht 65.0 in | Wt 142.4 lb

## 2017-08-04 DIAGNOSIS — E785 Hyperlipidemia, unspecified: Secondary | ICD-10-CM

## 2017-08-04 DIAGNOSIS — R55 Syncope and collapse: Secondary | ICD-10-CM

## 2017-08-04 DIAGNOSIS — Z9861 Coronary angioplasty status: Secondary | ICD-10-CM | POA: Diagnosis not present

## 2017-08-04 DIAGNOSIS — I2510 Atherosclerotic heart disease of native coronary artery without angina pectoris: Secondary | ICD-10-CM

## 2017-08-04 DIAGNOSIS — F410 Panic disorder [episodic paroxysmal anxiety] without agoraphobia: Secondary | ICD-10-CM

## 2017-08-04 MED ORDER — ALPRAZOLAM 0.25 MG PO TABS: ORAL_TABLET | ORAL | 3 refills | Status: DC

## 2017-08-04 MED ORDER — PRASUGREL HCL 5 MG PO TABS: 5.0000 mg | ORAL_TABLET | Freq: Every day | ORAL | 3 refills | Status: DC

## 2017-08-04 NOTE — Progress Notes (Signed)
PCP: Eulas Post, MD  Clinic Note: Chief Complaint  Patient presents with  . Follow-up    CAD- PCI    HPI: Robin Tapia is a 51 y.o. female with a PMH below who presents today for routine 6 month follow-up for her CAD-PCI. 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 She had a non-STEMI back in June 2014 and was found to have severe two-vessel disease status post two-vessel PCI. Post MI she has significant PTSD related panic attacks and near syncope. This is subsequently resolved. She is also been intolerant of statins because of her running.. Problem List: 1. CAD (coronary artery disease), native coronary artery - NSTEMI - S/P PCI x 2 of Circumflex and Diag 2 w/ DES 04/12/13  2. Non-ST elevated myocardial infarction (non-STEMI) ([place])  3. Dyslipidemia, goal LDL below 70 (statin intolerant) 4. Near syncope -- with orthostatic hypotension  5. Panic attacks - Post-traumatic stress reaction   Robin Tapia was last seen on 01/27/2017 - was pretty much doing well. Still has some panic attacks.  Recent Hospitalizations: None  Studies Personally Reviewed - (if available, images/films reviewed: From Epic Chart or Care Everywhere)  None  Interval History: Robin Tapia returns today for annual follow-up doing well. She has had no problems in the last year. No chest pain or pressure with rest or exertion. She has not been exercising as much of the last month or so because she "tweaked her ankle "during a run about 2 months ago running on some very uneven ground. She feels like she is getting a little bit lazy, but is still tried do elliptical training etc. stay in shape. She says at home her blood pressures are usually running normal whenever she checks them. Usually in the 130 millimeters range systolic. She is confused about the blood pressure here (although I did recheck it myself and had a similar result albeit slightly  lower at 150/76 mmHg).  She denies any headaches or blurred vision, dizziness, wooziness or syncope/near syncope. No TIA or amaurosis fugax symptoms. No rapid irregular heartbeats, but she does have occasional beats. Nothing prolonged and nothing worrisome.   No PND, orthopnea or edema. No melena, hematochezia, hematuria, or epstaxis -- on Effient. Only occasionally has heavy menses. Usually not. She notes it more when she has to use Tylenol or Motrin for migraine headache.  No claudication.   She denied any further episodes like she did this spring when she came in for a quick follow-up.  ROS: A comprehensive was performed.Pertinent symptoms noted above  Review of Systems  HENT: Positive for congestion.   Respiratory: Positive for cough. Negative for sputum production.        Recovering from a cold. Now only has slight cough, nonproductive.  Musculoskeletal: Positive for joint pain (Left ankle - see history of present illness).  Neurological: Positive for dizziness (Only notes this after strenuous exertion when she may be a little dehydrated.).  Endo/Heme/Allergies: Does not bruise/bleed easily.  Psychiatric/Behavioral: The patient is nervous/anxious (Less frequent panic attacks. Only short little anxiety spells, but usually controlled with deep breathing and relaxation.).   All other systems reviewed and are negative.  I have reviewed and (if needed) personally updated the patient's problem list, medications, allergies, past medical and surgical history, social and family history.   Past Medical History:  Diagnosis Date  . CAD S/P percutaneous coronary angioplasty 04/2013   - 100% D2, ~90% Cx-OM1;  EF 45-50%  . Dyslipidemia, goal LDL below 70    Reduced to 5 mg Crestor  . Fainting spell   . History of pneumonia ~2002   "walking PNA  . NSTEMI - S/P PCI and stenting x 2 of Circumflex and Diagonal Branch w/ DES 04/12/13 04/12/2013   Pathophysiology consistent with STEMI with occluded  diagonal branch he  . Piriformis muscle pain    LLE  . Presence of drug coated stent in a Diagonal branch of the LAD coronary artery 04/12/2013   Xence EXpd DES 2.25 mm x 15 mm - post-dil to 2.4 mm  . Presence of drug coated stent in left circumflex coronary artery 04/12/2013   Xience EXpd DES 3.0 mm x 23 mm - post-dil to 3.25 mm    Past Surgical History:  Procedure Laterality Date  . CORONARY ANGIOPLASTY WITH STENT PLACEMENT  03/12/13   Xience Exp. 3.43mm x 23 mm (post-dil to 3.33mm) - OM1; Xience Exp. 2.25 mm x 15 mm - Diag 2 (post-dil 2.4 mm)  . FINGER FRACTURE SURGERY Left ~2008   "ring finger" (04/11/2013)  . LEFT HEART CATHETERIZATION WITH CORONARY ANGIOGRAM N/A 04/12/2013   Procedure: LEFT HEART CATHETERIZATION WITH CORONARY ANGIOGRAM;  Surgeon: Lorretta Harp, MD;  Location: [facility] CATH LAB;  Service: Cardiovascular;  Laterality: N/A;  . NM MYOVIEW LTD  05/30/2013   Low Risk nuclear stress test with small area of mild perfusion defect in the anterolateral wall. Normal function. -- Consistent with diagonal branch disease    No outpatient prescriptions have been marked as taking for the 08/04/17 encounter (Office Visit) with Leonie Man, MD.    Allergies  Allergen Reactions  . Crestor [Rosuvastatin]     Muscle aches at 10 mg daily, 5 mg daily, and 5 mg twice weekly  . Lipitor [Atorvastatin]     Muscle aches     Social History   Social History  . Marital status: Married    Spouse name: N/A  . Number of children: N/A  . Years of education: N/A   Social History Main Topics  . Smoking status: Never Smoker  . Smokeless tobacco: Never Used  . Alcohol use No  . Drug use: No  . Sexual activity: Yes    Birth control/ protection: None   Other Topics Concern  . None   Social History Narrative   Socially she is in her second marriage. She has 4 children ages 11 and 21 from her first marriage ages 60 and 17 for her second marriage. Has no tobacco history but she admits to a  lifelong commitment to exercise and running competitively. She is eagerly and has been getting back into full training, and competitions.    family history includes Alcohol abuse in her mother; Hyperlipidemia in her father and mother; Hypertension in her father.  Wt Readings from Last 3 Encounters:  08/04/17 142 lb 6.4 oz (64.6 kg)  01/27/17 132 lb (59.9 kg)  07/31/16 134 lb 3.2 oz (60.9 kg)    PHYSICAL EXAM BP (!) 158/80   Pulse 60   Ht 5\' 5"  (1.651 m)   Wt 142 lb 6.4 oz (64.6 kg)   BMI 23.70 kg/m  Physical Exam  Constitutional: She is oriented to person, place, and time. She appears well-developed and well-nourished. No distress.  Very healthy-appearing. Well-groomed.  HENT:  Head: Normocephalic and atraumatic.  Eyes: EOM are normal.  Neck: Normal range of motion. Neck supple. No hepatojugular reflux and no JVD present. Carotid bruit  is not present.  Cardiovascular: Normal rate, regular rhythm, normal heart sounds and intact distal pulses.   Occasional extrasystoles are present. PMI is not displaced.  Exam reveals no gallop and no friction rub.   No murmur heard. Pulmonary/Chest: Effort normal and breath sounds normal. No respiratory distress. She has no wheezes. She has no rales.  Abdominal: Soft. Bowel sounds are normal. She exhibits no distension. There is no tenderness. There is no rebound.  Musculoskeletal: Normal range of motion. She exhibits no edema.  Neurological: She is alert and oriented to person, place, and time.  Skin: Skin is warm and dry. No rash noted. No erythema.  Psychiatric: She has a normal mood and affect. Her behavior is normal. Judgment and thought content normal.  Nursing note and vitals reviewed.    Adult ECG Report  Rate: 60 ;  Rhythm: normal sinus rhythm and Normal axis, intervals and durations.;   Narrative Interpretation: Normal stable EKG   Other studies Reviewed: Additional studies/ records that were reviewed today include:  Recent Labs:   Just had labs checked through work - will try to send results --> she reports that the labs were "pretty stable"   ASSESSMENT / PLAN: Problem List Items Addressed This Visit    CAD (coronary artery disease), native coronary artery - NSTEMI - S/P PCI x 2 of Circumflex and Diag 2 w/ DES 04/12/13 - Primary (Chronic)    2 vessel disease with an occluded diagonal branch as well as severe circumflex complex disease. 2 DES stents. Were patent during follow-up Myoview. She is very active running races etc. with no anginal symptoms.  Pretty much the only medication that she is on is her Effient. She has been reluctant to take statins because of myalgias that affect her running. With a resting heart rate of 60 beats minute, normal activity is beta blocker, especially because it may incite fatigue. Her blood pressures little high today, but usually has been well controlled, so had not thought about ACE inhibitor or ARB's. -- In the past she has had hypotension and orthostatic hypotension. I worry that she can continue hydrated during a run of an ACE inhibitor/ARB would be a bad option.      Relevant Orders   EKG 12-Lead (Completed)   Dyslipidemia, goal LDL below 70 (Chronic)    Unfortunately, I don't have labs. She was mostly last check, but didn't and then she had them checked through [place] screening program at work. She's been having results. She will take statins. She even seemed to be intolerant of the PCSK9 inhibitors. At this point, we really don't have much in the way of any options. 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  For now, she is reluctant to even try CoQ10.  She understands the risks, and we agreed to disagree that she would prefer not to take medications.      Relevant Orders   EKG 12-Lead (Completed)   Near syncope -- with orthostatic hypotension    History of vasovagal syncope, complicated by dehydration. As result, not to  treat her current blood pressure.      Relevant Orders   EKG 12-Lead (Completed)   Panic attacks (Chronic)    She is doing relatively well with these now. She still uses her when necessary Xanax which will refill for her now. I'm hoping actually that she can use less and less of the Xanax and that will help her overall  demeanor.  She never was interested in doing an SSRI.      Relevant Medications   ALPRAZolam (XANAX) 0.25 MG tablet   Other Relevant Orders   EKG 12-Lead (Completed)      Current medicines are reviewed at length with the patient today. (+/- concerns) n/a The following changes have been made: n/a  Patient Instructions  NO MEDICATION CHANGES   CHECK BLOOD PRESSURE PERIODICALLY OVER NEXT 3 MONTHS - LET OFFICE KNOW IF RANGE STAYS ABOVE 140 /90    Your physician wants you to follow-up in [place] DR Hatley Henegar. You will receive a reminder letter in the mail two months in advance. If you don't receive a letter, please call our office to schedule the follow-up appointment.   If you need a refill on your cardiac medications before your next appointment, please call your pharmacy.    Studies Ordered:   Orders Placed This Encounter  Procedures  . EKG 12-Lead     Glenetta Hew, M.D., M.S. Interventional Cardiologist   Pager # [phone] Phone # [phone] [address] [place], [place] [address]

## 2017-08-04 NOTE — Patient Instructions (Signed)
NO MEDICATION CHANGES   CHECK BLOOD PRESSURE PERIODICALLY OVER NEXT 3 MONTHS - LET OFFICE KNOW IF RANGE STAYS ABOVE 140 /90    Your physician wants you to follow-up in [place]. You will receive a reminder letter in the mail two months in advance. If you don't receive a letter, please call our office to schedule the follow-up appointment.   If you need a refill on your cardiac medications before your next appointment, please call your pharmacy.

## 2017-08-06 ENCOUNTER — Encounter: Payer: Self-pay | Admitting: Cardiology

## 2017-08-06 NOTE — Assessment & Plan Note (Signed)
She is doing relatively well with these now. She still uses her when necessary Xanax which will refill for her now. I'm hoping actually that she can use less and less of the Xanax and that will help her overall demeanor.  She never was interested in doing an SSRI.

## 2017-08-06 NOTE — Assessment & Plan Note (Signed)
Unfortunately, I don't have labs. She was mostly last check, but didn't and then she had them checked through [place] screening program at work. She's been having results. She will take statins. She even seemed to be intolerant of the PCSK9 inhibitors. At this point, we really don't have much in the way of any options. 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  For now, she is reluctant to even try CoQ10.  She understands the risks, and we agreed to disagree that she would prefer not to take medications.

## 2017-08-06 NOTE — Assessment & Plan Note (Signed)
History of vasovagal syncope, complicated by dehydration. As result, not to treat her current blood pressure.

## 2017-08-06 NOTE — Assessment & Plan Note (Signed)
2 vessel disease with an occluded diagonal branch as well as severe circumflex complex disease. 2 DES stents. Were patent during follow-up Myoview. She is very active running races etc. with no anginal symptoms.  Pretty much the only medication that she is on is her Effient. She has been reluctant to take statins because of myalgias that affect her running. With a resting heart rate of 60 beats minute, normal activity is beta blocker, especially because it may incite fatigue. Her blood pressures little high today, but usually has been well controlled, so had not thought about ACE inhibitor or ARB's. -- In the past she has had hypotension and orthostatic hypotension. I worry that she can continue hydrated during a run of an ACE inhibitor/ARB would be a bad option.

## 2017-11-29 ENCOUNTER — Other Ambulatory Visit: Payer: Self-pay | Admitting: Obstetrics and Gynecology

## 2017-11-29 ENCOUNTER — Encounter: Payer: Self-pay | Admitting: Gastroenterology

## 2017-11-29 DIAGNOSIS — R928 Other abnormal and inconclusive findings on diagnostic imaging of breast: Secondary | ICD-10-CM

## 2017-12-02 ENCOUNTER — Ambulatory Visit
Admission: RE | Admit: 2017-12-02 | Discharge: 2017-12-02 | Disposition: A | Discharge status: Home / Self Care | Payer: 59 | Source: Ambulatory Visit | Attending: Obstetrics and Gynecology | Admitting: Obstetrics and Gynecology

## 2017-12-02 ENCOUNTER — Other Ambulatory Visit: Payer: Self-pay | Admitting: Obstetrics and Gynecology

## 2017-12-02 DIAGNOSIS — R928 Other abnormal and inconclusive findings on diagnostic imaging of breast: Secondary | ICD-10-CM

## 2017-12-02 DIAGNOSIS — N6489 Other specified disorders of breast: Secondary | ICD-10-CM

## 2018-01-05 ENCOUNTER — Encounter: Payer: Self-pay | Admitting: Gastroenterology

## 2018-01-05 ENCOUNTER — Ambulatory Visit (INDEPENDENT_AMBULATORY_CARE_PROVIDER_SITE_OTHER): Payer: 59 | Admitting: Gastroenterology

## 2018-01-05 ENCOUNTER — Telehealth: Payer: Self-pay | Admitting: *Deleted

## 2018-01-05 VITALS — BP 106/62 | HR 70 | Ht 65.0 in | Wt 144.0 lb

## 2018-01-05 DIAGNOSIS — Z1211 Encounter for screening for malignant neoplasm of colon: Secondary | ICD-10-CM

## 2018-01-05 MED ORDER — SUPREP BOWEL PREP KIT 17.5-3.13-1.6 GM/177ML PO SOLN: 1.0000 | ORAL | 0 refills | Status: DC

## 2018-01-05 NOTE — Progress Notes (Signed)
HPI: This is a very pleasant 52 year old woman who was referred to me by Marylynn Pearson, MD  to evaluate colon cancer screening.    Chief complaint is routine risk for colon cancer   With she was sent to discuss colon cancer screening options.  She has no overt GI symptoms.  Specifically no overt bleeding, no significant bowel changes, no unintentional weight losses, no significant abdominal pains.   Mild AMI years ago, still on effient mainly per her preference.  She tells me her cardiologist has recommended that she stop it in the past    Review of systems: Pertinent positive and negative review of systems were noted in the above HPI section. All other review negative.   Past Medical History:  Diagnosis Date  . CAD S/P percutaneous coronary angioplasty 04/2013   - 100% D2, ~90% Cx-OM1; EF 45-50%  . Dyslipidemia, goal LDL below 70    Reduced to 5 mg Crestor  . Fainting spell   . History of pneumonia ~2002   "walking PNA  . NSTEMI - S/P PCI and stenting x 2 of Circumflex and Diagonal Branch w/ DES 04/12/13 04/12/2013   Pathophysiology consistent with STEMI with occluded diagonal branch he  . Piriformis muscle pain    LLE  . Presence of drug coated stent in a Diagonal branch of the LAD coronary artery 04/12/2013   Xence EXpd DES 2.25 mm x 15 mm - post-dil to 2.4 mm  . Presence of drug coated stent in left circumflex coronary artery 04/12/2013   Xience EXpd DES 3.0 mm x 23 mm - post-dil to 3.25 mm    Past Surgical History:  Procedure Laterality Date  . CORONARY ANGIOPLASTY WITH STENT PLACEMENT  03/12/13   Xience Exp. 3.64mm x 23 mm (post-dil to 3.59mm) - OM1; Xience Exp. 2.25 mm x 15 mm - Diag 2 (post-dil 2.4 mm)  . FINGER FRACTURE SURGERY Left ~2008   "ring finger" (04/11/2013)  . LEFT HEART CATHETERIZATION WITH CORONARY ANGIOGRAM N/A 04/12/2013   Procedure: LEFT HEART CATHETERIZATION WITH CORONARY ANGIOGRAM;  Surgeon: Lorretta Harp, MD;  Location: [facility] CATH LAB;  Service:  Cardiovascular;  Laterality: N/A;  . NM MYOVIEW LTD  05/30/2013   Low Risk nuclear stress test with small area of mild perfusion defect in the anterolateral wall. Normal function. -- Consistent with diagonal branch disease    Current Outpatient Medications  Medication Sig Dispense Refill  . ALPRAZolam (XANAX) 0.25 MG tablet TAKE 1 TABLET 3 TIMES A DAY AS NEEDED FOR ANXIETY 90 tablet 3  . prasugrel (EFFIENT) 5 MG TABS tablet Take 1 tablet (5 mg total) by mouth daily. 90 tablet 3   No current facility-administered medications for this visit.     Allergies as of 01/05/2018 - Review Complete 01/05/2018  Allergen Reaction Noted  . Crestor [rosuvastatin]  02/20/2014  . Lipitor [atorvastatin]  02/20/2014    Family History  Problem Relation Age of Onset  . Hypertension Father   . Hyperlipidemia Father   . Alcohol abuse Mother   . Hyperlipidemia Mother     Social History   Socioeconomic History  . Marital status: Married    Spouse name: Not on file  . Number of children: Not on file  . Years of education: Not on file  . Highest education level: Not on file  Social Needs  . Financial resource strain: Not on file  . Food insecurity - worry: Not on file  . Food insecurity - inability: Not on file  .  Transportation needs - medical: Not on file  . Transportation needs - non-medical: Not on file  Occupational History  . Not on file  Tobacco Use  . Smoking status: Never Smoker  . Smokeless tobacco: Never Used  Substance and Sexual Activity  . Alcohol use: No  . Drug use: No  . Sexual activity: Yes    Birth control/protection: None  Other Topics Concern  . Not on file  Social History Narrative   Socially she is in her second marriage. She has 4 children ages 57 and 52 from her first marriage ages 28 and 58 for her second marriage. Has no tobacco history but she admits to a lifelong commitment to exercise and running competitively. She is eagerly and has been getting back into full  training, and competitions.     Physical Exam: BP 106/62   Pulse 70   Ht 5\' 5"  (1.651 m)   Wt 144 lb (65.3 kg)   BMI 23.96 kg/m  Constitutional: generally well-appearing Psychiatric: alert and oriented x3 Eyes: extraocular movements intact Mouth: oral pharynx moist, no lesions Neck: supple no lymphadenopathy Cardiovascular: heart regular rate and rhythm Lungs: clear to auscultation bilaterally Abdomen: soft, nontender, nondistended, no obvious ascites, no peritoneal signs, normal bowel sounds Extremities: no lower extremity edema bilaterally Skin: no lesions on visible extremities   Assessment and plan: 52 y.o. female with routine risk for colon cancer, daily blood thinner use for previous cardiac disease  We discussed colon cancer screening options including stool based tests and her colonoscopy.  She she decided to go ahead with colonoscopy.  She understands that she is at increased risk for procedural complications given her daily blood thinner use and so we will communicate with her cardiologist about the safety of her holding that medicine for 7 days.  I see no reason for any further blood tests or imaging studies prior to then.   Please see the "Patient Instructions" section for addition details about the plan.   Owens Loffler, MD [facility] 01/05/2018, 8:51 AM  Cc: Marylynn Pearson, MD

## 2018-01-05 NOTE — Telephone Encounter (Signed)
   POETRY CERRO 11-24-1965 [id number]  Dear Cardiology Team (Dr Ellyn Hack):  We have scheduled the above named patient for a(n) colonoscopy procedure. Our records show that (s)he is on anticoagulation therapy.  Please advise as to whether the patient may come off their therapy of Effient 7 days prior to their procedure which is scheduled for 01/19/18.  Please route your response to Dixon Boos, [place] or fax response to [phone].  Sincerely,  [facility]

## 2018-01-05 NOTE — Patient Instructions (Addendum)
You have been scheduled for a colonoscopy. Please follow written instructions given to you at your visit today.  Please pick up your prep supplies at the pharmacy within the next 1-3 days. If you use inhalers (even only as needed), please bring them with you on the day of your procedure. Your physician has requested that you go to www.startemmi.com and enter the access code given to you at your visit today. This web site gives a general overview about your procedure. However, you should still follow specific instructions given to you by our office regarding your preparation for the procedure.  You will be contacted by our office prior to your procedure for directions on holding your Effient.  If you do not hear from our office 1 week prior to your scheduled procedure, please call [phone] to discuss.   If you are age 53 or older, your body mass index should be between 23-30. Your Body mass index is 23.96 kg/m. If this is out of the aforementioned range listed, please consider follow up with your Primary Care Provider.  If you are age 58 or younger, your body mass index should be between 19-25. Your Body mass index is 23.96 kg/m. If this is out of the aformentioned range listed, please consider follow up with your Primary Care Provider.

## 2018-01-05 NOTE — Addendum Note (Signed)
Addended by: Larina Bras on: 01/05/2018 09:52 AM   Modules accepted: Orders

## 2018-01-06 NOTE — Telephone Encounter (Signed)
Dr. Ellyn Hack, Can patient hold Effient for 7 days for colonoscopy? Last PCI 04/12/13.  Please forward your response to [facility]. Thank you

## 2018-01-08 NOTE — Telephone Encounter (Signed)
Yes  Tysen Roesler, MD  

## 2018-01-10 ENCOUNTER — Encounter: Payer: Self-pay | Admitting: Gastroenterology

## 2018-01-11 NOTE — Telephone Encounter (Signed)
Patient has been advised that per Dr Ellyn Hack, she may hold Effient 7 days prior to colonoscopy. She verbalizes understanding.

## 2018-01-19 ENCOUNTER — Ambulatory Visit (AMBULATORY_SURGERY_CENTER): Payer: 59 | Admitting: Gastroenterology

## 2018-01-19 ENCOUNTER — Encounter: Payer: Self-pay | Admitting: Gastroenterology

## 2018-01-19 VITALS — BP 114/70 | HR 58 | Temp 98.9°F | Resp 10 | Ht 65.0 in | Wt 144.0 lb

## 2018-01-19 DIAGNOSIS — Z1211 Encounter for screening for malignant neoplasm of colon: Secondary | ICD-10-CM

## 2018-01-19 DIAGNOSIS — D124 Benign neoplasm of descending colon: Secondary | ICD-10-CM | POA: Diagnosis not present

## 2018-01-19 MED ORDER — SODIUM CHLORIDE 0.9 % IV SOLN: 500.0000 mL | Freq: Once | INTRAVENOUS | Status: DC

## 2018-01-19 NOTE — Op Note (Signed)
[facility] Patient Name: Robin Tapia Procedure Date: 01/19/2018 10:24 AM MRN: [id number] Endoscopist: Milus Banister , MD Age: 53 Referring MD:  Date of Birth: 25-Sep-1966 Gender: Female Account #: [id number] Procedure:                Colonoscopy Indications:              Screening for colorectal malignant neoplasm Medicines:                Monitored Anesthesia Care Procedure:                Pre-Anesthesia Assessment:                           - Prior to the procedure, a History and Physical                            was performed, and patient medications and                            allergies were reviewed. The patient's tolerance of                            previous anesthesia was also reviewed. The risks                            and benefits of the procedure and the sedation                            options and risks were discussed with the patient.                            All questions were answered, and informed consent                            was obtained. Prior Anticoagulants: The patient has                            taken effient last dose was 7 days prior to                            procedure. [ASA Grade]. After reviewing the risks                            and benefits, the patient was deemed in                            satisfactory condition to undergo the procedure.                           After obtaining informed consent, the colonoscope                            was passed under direct vision. Throughout the  procedure, the patient's blood pressure, pulse, and                            oxygen saturations were monitored continuously. The                            Colonoscope was introduced through the anus and                            advanced to the the cecum, identified by                            appendiceal orifice and ileocecal valve. The                            quality of the bowel  preparation was good. The                            ileocecal valve, appendiceal orifice, and rectum                            were photographed. Scope In: 10:26:25 AM Scope Out: 10:34:59 AM Scope Withdrawal Time: 0 hours 6 minutes 8 seconds  Total Procedure Duration: 0 hours 8 minutes 34 seconds  Findings:                 A 3 mm polyp was found in the descending colon. The                            polyp was sessile. The polyp was removed with a                            cold snare. Resection and retrieval were complete.                           The exam was otherwise without abnormality on                            direct and retroflexion views. Complications:            No immediate complications. Estimated blood loss:                            None. Estimated Blood Loss:     Estimated blood loss: none. Impression:               - One 3 mm polyp in the descending colon, removed                            with a cold snare. Resected and retrieved.                           - The examination was otherwise normal on direct  and retroflexion views. Recommendation:           - Patient has a contact number available for                            emergencies. The signs and symptoms of potential                            delayed complications were discussed with the                            patient. Return to normal activities tomorrow.                            Written discharge instructions were provided to the                            patient.                           - Resume previous diet.                           - Continue present medications. OK to restart your                            effient tomorrow.                           You will receive a letter within 2-3 weeks with the                            pathology results and my final recommendations.                           If the polyp(s) is proven to be 'pre-cancerous' on                             pathology, you will need repeat colonoscopy in 5                            years. If the polyp(s) is NOT 'precancerous' on                            pathology then you should repeat colon cancer                            screening in 10 years with colonoscopy without need                            for colon cancer screening by any method prior to                            then (including stool testing). Milus Banister, MD 01/19/2018 10:38:38 AM This report has been signed  electronically.

## 2018-01-19 NOTE — Progress Notes (Signed)
Called to room to assist during endoscopic procedure.  Patient ID and intended procedure confirmed with present staff. Received instructions for my participation in the procedure from the performing physician.  

## 2018-01-19 NOTE — Patient Instructions (Signed)
POLYP INFORMATION GIVEN.    YOU HAD AN ENDOSCOPIC PROCEDURE TODAY AT THE [facility]:   Refer to the procedure report that was given to you for any specific questions about what was found during the examination.  If the procedure report does not answer your questions, please call your gastroenterologist to clarify.  If you requested that your care partner not be given the details of your procedure findings, then the procedure report has been included in a sealed envelope for you to review at your convenience later.  YOU SHOULD EXPECT: Some feelings of bloating in the abdomen. Passage of more gas than usual.  Walking can help get rid of the air that was put into your GI tract during the procedure and reduce the bloating. If you had a lower endoscopy (such as a colonoscopy or flexible sigmoidoscopy) you may notice spotting of blood in your stool or on the toilet paper. If you underwent a bowel prep for your procedure, you may not have a normal bowel movement for a few days.  Please Note:  You might notice some irritation and congestion in your nose or some drainage.  This is from the oxygen used during your procedure.  There is no need for concern and it should clear up in a day or so.  SYMPTOMS TO REPORT IMMEDIATELY:   Following lower endoscopy (colonoscopy or flexible sigmoidoscopy):  Excessive amounts of blood in the stool  Significant tenderness or worsening of abdominal pains  Swelling of the abdomen that is new, acute  Fever of 100F or higher   For urgent or emergent issues, a gastroenterologist can be reached at any hour by calling [phone].   DIET:  We do recommend a small meal at first, but then you may proceed to your regular diet.  Drink plenty of fluids but you should avoid alcoholic beverages for 24 hours.  ACTIVITY:  You should plan to take it easy for the rest of today and you should NOT DRIVE or use heavy machinery until tomorrow (because of the sedation  medicines used during the test).    FOLLOW UP: Our staff will call the number listed on your records the next business day following your procedure to check on you and address any questions or concerns that you may have regarding the information given to you following your procedure. If we do not reach you, we will leave a message.  However, if you are feeling well and you are not experiencing any problems, there is no need to return our call.  We will assume that you have returned to your regular daily activities without incident.  If any biopsies were taken you will be contacted by phone or by letter within the next 1-3 weeks.  Please call us at [phone] if you have not heard about the biopsies in 3 weeks.    SIGNATURES/CONFIDENTIALITY: You and/or your care partner have signed paperwork which will be entered into your electronic medical record.  These signatures attest to the fact that that the information above on your After Visit Summary has been reviewed and is understood.  Full responsibility of the confidentiality of this discharge information lies with you and/or your care-partner. 

## 2018-01-19 NOTE — Progress Notes (Signed)
Report to PACU, RN, vss, BBS= Clear.  

## 2018-01-19 NOTE — Progress Notes (Signed)
Pt's states no medical or surgical changes since previsit or office visit. 

## 2018-01-20 ENCOUNTER — Telehealth: Payer: Self-pay | Admitting: *Deleted

## 2018-01-20 NOTE — Telephone Encounter (Signed)
  Follow up Call-  Call back number 01/19/2018  Post procedure Call Back phone  # [phone]  Permission to leave phone message Yes  Some recent data might be hidden     Patient questions:  Do you have a fever, pain , or abdominal swelling? No. Pain Score  0 *  Have you tolerated food without any problems? Yes.    Have you been able to return to your normal activities? Yes.    Do you have any questions about your discharge instructions: Diet   No. Medications  No. Follow up visit  No.  Do you have questions or concerns about your Care? No.  Actions: * If pain score is 4 or above: No action needed, pain <4.

## 2018-01-26 ENCOUNTER — Encounter: Payer: Self-pay | Admitting: Gastroenterology

## 2018-02-02 ENCOUNTER — Telehealth: Payer: Self-pay | Admitting: Gastroenterology

## 2018-02-02 NOTE — Telephone Encounter (Signed)
Advise her to see her PCP or urgent clinic, sounds external to her GI tract, may be a polynidol cyst.

## 2018-02-02 NOTE — Telephone Encounter (Signed)
Please advise 

## 2018-02-02 NOTE — Telephone Encounter (Signed)
Patient advised to contact her PCP or see an UC doctor for evaluation.

## 2018-02-04 ENCOUNTER — Ambulatory Visit (INDEPENDENT_AMBULATORY_CARE_PROVIDER_SITE_OTHER): Payer: 59

## 2018-02-04 ENCOUNTER — Encounter: Payer: Self-pay | Admitting: Family Medicine

## 2018-02-04 ENCOUNTER — Ambulatory Visit (INDEPENDENT_AMBULATORY_CARE_PROVIDER_SITE_OTHER): Payer: 59 | Admitting: Family Medicine

## 2018-02-04 VITALS — BP 124/80 | HR 87 | Temp 98.0°F | Ht 65.0 in | Wt 141.9 lb

## 2018-02-04 DIAGNOSIS — M533 Sacrococcygeal disorders, not elsewhere classified: Secondary | ICD-10-CM

## 2018-02-04 NOTE — Progress Notes (Addendum)
Subjective:     Patient ID: Robin Tapia, female   DOB: 06-Aug-1966, 52 y.o.   MRN: [id number]  HPI Patient seen with recent "soreness" in her sacral area about 10 days ago. No specific injury. She is very active with running and swimming. No history of known pilonidal cyst, sebaceous cyst/ abscess. She has not noted any redness or warmth. Pain was just left of midline upper sacral region. She's been able to run without any difficulties past few days. No fevers or chills. Appetite and weight stable.  She and her husband noted some firmness to palpation (and visible asymmetry) in that region a few days ago.  Recent colonoscopy 3-13 with tubular adenoma.  No complications with procedure.  Past Medical History:  Diagnosis Date  . CAD S/P percutaneous coronary angioplasty 04/2013   - 100% D2, ~90% Cx-OM1; EF 45-50%  . Dyslipidemia, goal LDL below 70    Reduced to 5 mg Crestor  . Fainting spell   . History of pneumonia ~2002   "walking PNA  . NSTEMI - S/P PCI and stenting x 2 of Circumflex and Diagonal Branch w/ DES 04/12/13 04/12/2013   Pathophysiology consistent with STEMI with occluded diagonal branch he  . Piriformis muscle pain    LLE  . Presence of drug coated stent in a Diagonal branch of the LAD coronary artery 04/12/2013   Xence EXpd DES 2.25 mm x 15 mm - post-dil to 2.4 mm  . Presence of drug coated stent in left circumflex coronary artery 04/12/2013   Xience EXpd DES 3.0 mm x 23 mm - post-dil to 3.25 mm   Past Surgical History:  Procedure Laterality Date  . CORONARY ANGIOPLASTY WITH STENT PLACEMENT  03/12/13   Xience Exp. 3.24mm x 23 mm (post-dil to 3.39mm) - OM1; Xience Exp. 2.25 mm x 15 mm - Diag 2 (post-dil 2.4 mm)  . FINGER FRACTURE SURGERY Left ~2008   "ring finger" (04/11/2013)  . LEFT HEART CATHETERIZATION WITH CORONARY ANGIOGRAM N/A 04/12/2013   Procedure: LEFT HEART CATHETERIZATION WITH CORONARY ANGIOGRAM;  Surgeon: Lorretta Harp, MD;  Location: [facility] CATH LAB;  Service:  Cardiovascular;  Laterality: N/A;  . NM MYOVIEW LTD  05/30/2013   Low Risk nuclear stress test with small area of mild perfusion defect in the anterolateral wall. Normal function. -- Consistent with diagonal branch disease    reports that she has never smoked. She has never used smokeless tobacco. She reports that she does not drink alcohol or use drugs. family history includes Alcohol abuse in her mother; Hyperlipidemia in her father and mother; Hypertension in her father. Allergies  Allergen Reactions  . Crestor [Rosuvastatin]     Muscle aches at 10 mg daily, 5 mg daily, and 5 mg twice weekly  . Lipitor [Atorvastatin]     Muscle aches      Review of Systems  Constitutional: Negative for appetite change, chills, fever and unexpected weight change.  Gastrointestinal: Negative for abdominal pain, diarrhea, nausea and vomiting.  Hematological: Negative for adenopathy.       Objective:   Physical Exam  Constitutional: She appears well-developed and well-nourished.  Cardiovascular: Normal rate.  Musculoskeletal:  Patient has firm prominence (non-mobile) left upper sacral region. Nontender to palpation. No overlying skin changes.  Skin:  No erythema.       Assessment:     Recent sacral region pain with firm/nonmobile subcutaneous left sided prominence and asymmetry on exam.  No evidence for soft tissue abscess or cyst  Plan:     -Start with plain films of the sacrum and coccyx -May need further imaging to help clarify  Eulas Post MD [facility]: There is no evidence of fracture or other focal bone lesions. SI joints are symmetric and unremarkable.  IMPRESSION: Negative.  Patient notified of results.  Discussed with radiologist.  They suggest consideration for CT pelvis with IV contrast to further assess.  Pt is concerned about cost b/o high deductible.  We also discussed possibility of referral to sports medicine-though not  sure how useful Ultrasound would be in evaluation.   Eulas Post MD [facility]

## 2018-02-04 NOTE — Patient Instructions (Signed)
Go for X-ray at [facility]

## 2018-02-09 ENCOUNTER — Telehealth: Payer: Self-pay | Admitting: Family Medicine

## 2018-02-09 NOTE — Telephone Encounter (Signed)
Copied from [place] [id number]. Topic: General - Other >> Feb 09, 2018  4:28 PM Ivar Drape wrote: Reason for CRM:  Patient would like a copy of the xray she had done at [facility] last week. She would also like to know if Dr. Elease Hashimoto has set up a referral with Dr. Manuela Schwartz.

## 2018-02-10 NOTE — Addendum Note (Signed)
Addended by: Eulas Post on: 02/10/2018 12:04 PM   Modules accepted: Orders

## 2018-02-10 NOTE — Telephone Encounter (Signed)
Let pt know I have put in referral to see Dr Oneida Alar or Dr Micheline [facility] with sports medicine.  She should be able to get a copy of her X-ray from [facility].

## 2018-02-11 NOTE — Telephone Encounter (Signed)
Left message on machine for patient to return our call.  CRM created 

## 2018-02-16 ENCOUNTER — Encounter: Payer: Self-pay | Admitting: Family Medicine

## 2018-02-16 ENCOUNTER — Ambulatory Visit (INDEPENDENT_AMBULATORY_CARE_PROVIDER_SITE_OTHER): Payer: 59 | Admitting: Family Medicine

## 2018-02-16 VITALS — BP 138/76 | Ht 65.0 in | Wt 140.0 lb

## 2018-02-16 DIAGNOSIS — M533 Sacrococcygeal disorders, not elsewhere classified: Secondary | ICD-10-CM

## 2018-02-16 NOTE — Progress Notes (Signed)
Chief complaint: Sacral prominence x 3 weeks  History of present illness: Robin Tapia a 52 year old female presents to sports medicine office today with chief complaint of sacral prominence.  She reports that she first noticed symptoms about 3 weeks ago.  She reports that she had a colonoscopy 4 weeks ago and shortly after the colonoscopy noted left-sided sacral prominence.  She does not report of any pain or fluctuance with this.  No surrounding warmth, erythema, ecchymosis, or effusion.  She reports of no specific tenderness to palpation.  She reports that she has been rubbing and feeling on it to see if it moves. She reports with with the constant rubbing that she has been getting a little bit irritation from that.  She reports no recent changes in size or characteristics over the last 3 weeks.  She does not report of any pain when sitting down.  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  She reports what she is mainly concerned about is the possibility of skin or soft tissue infection, as well as possibility of this being a tumor.  She went and saw her primary physician about a little over a week ago for initial presentation and evaluation of this.  X-ray of her sacrum was performed, which fortunately did not show any acute bony abnormality, bone, or soft tissue mass.  She was kindly referred here for further evaluation to see if there is anything additional that we could help provide from a sports medicine standpoint.  She is perimenopausal, does report a having hot flashes and night sweats.  She does not report of any fevers, chills, or any unintentional weight loss.  She does not report of any abdominal bloating, change in bowel habits, melena, hematochezia, or any change in appetite. She does not report of any bowel or bladder incontinence, does not report of any saddle anesthesia.  Review of systems:  As stated above  Her past medical  history, surgical history, family history, and social history obtained and reviewed.  Her past medical history is notable for CAD, with history of NSTEMI s/p PCI x2, anxiety, and panic attacks; her surgical history is notable for LHC with PCI and stent placement x 2, and left finger fracture surgery; she does not report of any current tobacco use; family history is notable for hypertension, hyperlipidemia, and alcohol abuse; allergies and medications are reviewed and are reflected in EMR.  Physical exam: Vital signs are reviewed and are documented in the chart Gen.: Alert, oriented, appears stated age, in no apparent distress HEENT: Moist oral mucosa Respiratory: Normal respirations, able to speak in full sentences Cardiac: Regular rate, distal pulses 2+ Integumentary: No rashes on visible skin:  Neurologic: Strength 5/5, sensation 2+ in bilateral lower extremities Psych: Normal affect, mood is described as good Musculoskeletal: Inspection of her sacrum and coccyx reveal no obvious deformity or muscle atrophy, no warmth, erythema, ecchymosis, or effusion, do feel a slight bony prominence over the left sacral process in comparison to the right side, do not feel any type of fluctuance or mobility with this, no evidence of pilonidal cyst, sebaceous cyst, cellulitis, or skin abrasion, no evidence of any skin breakdown, no tenderness over the SI joints on both sides, no tenderness of the lumbar spine or paraspinal lumbar processes, no tenderness over the anterior or lateral hips on both sides, no antalgic gait with ambulation, no pain with back flexion, back extension, or  lateral twisting  Assessment and plan: 1. 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  I have a low suspicion that this is related to any type of malignancy, neither primary to the sacrum or  metastatic.  Discussed that this is an unusual location to have a primary bone tumor. She is not having any constitutional symptoms and does not report of any changes in the size.  There is no mass that is mobile or fluctuant.  There is no evidence to suggest any type of skin or soft tissue infection, including cellulitis, sebaceous cyst, or pilonidal cyst.  Discussed to continue keeping an eye to see if there are any changes in size or characteristic of this prominence.  Fortunately, she is not having any abdominal symptoms.  Discussed lower suspicion of any intra-abdominal pathology given lack of any symptoms otherwise.  She reports that she was told by her primary care physician that a CT scan of her abdomen and pelvis could be done to rule out any intra-abdominal pathology.  I discussed that prognostically outlook looks good given lack of constitutional and GI symptoms. I discussed to have conversation with her primary physician and GI physician regarding this and will defer any decision regarding CT scan to them.  Discussed that MRI of her pelvis/sacrum could be done for further evaluation if she notices any change.  Right now, will watchfully monitor symptoms to see if there is any changes.  She will follow-up here on as-needed basis otherwise.  Mort Sawyers, M.D. [facility]

## 2018-02-22 NOTE — Telephone Encounter (Signed)
Patient has not returned call as off 02/22/18.

## 2018-06-06 ENCOUNTER — Ambulatory Visit
Admission: RE | Admit: 2018-06-06 | Discharge: 2018-06-06 | Disposition: A | Discharge status: Home / Self Care | Payer: 59 | Source: Ambulatory Visit | Attending: Obstetrics and Gynecology | Admitting: Obstetrics and Gynecology

## 2018-06-06 ENCOUNTER — Ambulatory Visit: Payer: 59

## 2018-06-06 DIAGNOSIS — N6489 Other specified disorders of breast: Secondary | ICD-10-CM

## 2018-09-04 ENCOUNTER — Other Ambulatory Visit: Payer: Self-pay | Admitting: Cardiology

## 2018-12-05 ENCOUNTER — Other Ambulatory Visit: Payer: Self-pay | Admitting: Obstetrics and Gynecology

## 2018-12-05 DIAGNOSIS — N6489 Other specified disorders of breast: Secondary | ICD-10-CM

## 2018-12-08 ENCOUNTER — Other Ambulatory Visit: Payer: Self-pay | Admitting: Cardiology

## 2018-12-12 ENCOUNTER — Telehealth: Payer: Self-pay | Admitting: *Deleted

## 2018-12-12 NOTE — Telephone Encounter (Signed)
PRIOR AUTHORIZATION  COMPLETED  FOR PRASUGREL 5 MG    KEY # [id number]  DX I25.10, I21.4,  Z98.61  MEDICATION WAS APPROVED FOR 30 DAY SUPPLY   CALLED AND NOTIFIED PHARMACY NEW RX GIVEN FOR 30 DAY SUPPLY X 4 REFILLS

## 2018-12-20 ENCOUNTER — Ambulatory Visit
Admission: RE | Admit: 2018-12-20 | Discharge: 2018-12-20 | Disposition: A | Discharge status: Home / Self Care | Payer: 59 | Source: Ambulatory Visit | Attending: Obstetrics and Gynecology | Admitting: Obstetrics and Gynecology

## 2018-12-20 ENCOUNTER — Ambulatory Visit: Payer: 59

## 2018-12-20 DIAGNOSIS — N6489 Other specified disorders of breast: Secondary | ICD-10-CM

## 2018-12-29 ENCOUNTER — Ambulatory Visit: Payer: 59 | Admitting: Cardiology

## 2018-12-29 ENCOUNTER — Encounter (INDEPENDENT_AMBULATORY_CARE_PROVIDER_SITE_OTHER): Payer: Self-pay

## 2018-12-29 ENCOUNTER — Encounter: Payer: Self-pay | Admitting: Cardiology

## 2018-12-29 VITALS — BP 158/92 | HR 53 | Ht 65.0 in | Wt 146.4 lb

## 2018-12-29 DIAGNOSIS — Z7902 Long term (current) use of antithrombotics/antiplatelets: Secondary | ICD-10-CM

## 2018-12-29 DIAGNOSIS — R55 Syncope and collapse: Secondary | ICD-10-CM | POA: Diagnosis not present

## 2018-12-29 DIAGNOSIS — I2510 Atherosclerotic heart disease of native coronary artery without angina pectoris: Secondary | ICD-10-CM | POA: Diagnosis not present

## 2018-12-29 DIAGNOSIS — E785 Hyperlipidemia, unspecified: Secondary | ICD-10-CM | POA: Diagnosis not present

## 2018-12-29 DIAGNOSIS — Z9861 Coronary angioplasty status: Secondary | ICD-10-CM | POA: Diagnosis not present

## 2018-12-29 DIAGNOSIS — I214 Non-ST elevation (NSTEMI) myocardial infarction: Secondary | ICD-10-CM

## 2018-12-29 DIAGNOSIS — F410 Panic disorder [episodic paroxysmal anxiety] without agoraphobia: Secondary | ICD-10-CM

## 2018-12-29 LAB — LIPID PANEL
Chol/HDL Ratio: 3.4 ratio (ref 0.0–4.4)
Cholesterol, Total: 222 mg/dL — ABNORMAL HIGH (ref 100–199)
HDL: 65 mg/dL (ref >39)
LDL Calculated: 138 mg/dL — ABNORMAL HIGH (ref 0–99)
Triglycerides: 93 mg/dL (ref 0–149)
VLDL Cholesterol Cal: 19 mg/dL (ref 5–40)

## 2018-12-29 LAB — COMPREHENSIVE METABOLIC PANEL
ALT: 20 IU/L (ref 0–32)
AST: 26 IU/L (ref 0–40)
Albumin/Globulin Ratio: 1.6 (ref 1.2–2.2)
Albumin: 4.4 g/dL (ref 3.8–4.9)
Alkaline Phosphatase: 89 IU/L (ref 39–117)
BILIRUBIN TOTAL: 0.3 mg/dL (ref 0.0–1.2)
BUN/Creatinine Ratio: 16 (ref 9–23)
BUN: 11 mg/dL (ref 6–24)
CHLORIDE: 104 mmol/L (ref 96–106)
CO2: 22 mmol/L (ref 20–29)
Calcium: 9.3 mg/dL (ref 8.7–10.2)
Creatinine, Ser: 0.69 mg/dL (ref 0.57–1.00)
GFR calc Af Amer: 115 mL/min/{1.73_m2} (ref >59)
GFR calc non Af Amer: 100 mL/min/{1.73_m2} (ref >59)
Globulin, Total: 2.7 g/dL (ref 1.5–4.5)
Glucose: 107 mg/dL — ABNORMAL HIGH (ref 65–99)
Potassium: 5.1 mmol/L (ref 3.5–5.2)
Sodium: 140 mmol/L (ref 134–144)
Total Protein: 7.1 g/dL (ref 6.0–8.5)

## 2018-12-29 MED ORDER — PRASUGREL HCL 5 MG PO TABS: 5.0000 mg | ORAL_TABLET | ORAL | 3 refills | Status: DC

## 2018-12-29 MED ORDER — ALPRAZOLAM 0.25 MG PO TABS: ORAL_TABLET | ORAL | 1 refills | Status: DC

## 2018-12-29 MED ORDER — ASPIRIN EC 81 MG PO TBEC: 81.0000 mg | DELAYED_RELEASE_TABLET | ORAL | 3 refills | Status: AC

## 2018-12-29 NOTE — Progress Notes (Signed)
PCP: Eulas Post, MD  Clinic Note: No chief complaint on file.   HPI: Robin Tapia is a 53 y.o. female with a h/o CAD-PCI noted below who presents today for delayed annual follow-up.  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 She had a non-STEMI back in June 2014 and was found to have severe two-vessel disease status post two-vessel PCI. Post MI she has significant PTSD related panic attacks and near syncope. This is subsequently resolved. She is also been intolerant of statins because of her running --will not take them or other medicines. Problem List: 1. CAD (coronary artery disease), native coronary artery - NSTEMI - S/P PCI x 2 of Circumflex and Diag 2 w/ DES 04/12/13  2. Non-ST elevated myocardial infarction (non-STEMI) ([place])  3. Dyslipidemia, goal LDL below 70 (statin intolerant -but not interested in other options) 4. Near syncope -- with orthostatic hypotension  5. Panic attacks - Post-traumatic stress reaction   Robin Tapia was last seen in Sept 2018 -- was doing well.  NO problems.  No CP. No more panic attacks. Still running (but had a "bum ankle")  Recent Hospitalizations: None  Studies Personally Reviewed - (if available, images/films reviewed: From Epic Chart or Care Everywhere)  None  Interval History: Robin Tapia returns today for annual follow-up doing well. She has had no problems in the last year. No chest pain or pressure with rest or exertion. She has not been exercising as much of the last month or so because she "tweaked her ankle "during a run about 2 months ago running on some very uneven ground. She feels like she is getting a little bit lazy, but is still tried do elliptical training etc. stay in shape. She says at home her blood pressures are usually running normal whenever she checks them. Usually in the SBP in 130 mmHg range. She is confused about the blood pressure  here (although I did recheck it myself and had a similar result albeit slightly lower at 150/76 mmHg).  She denies any headaches or blurred vision, dizziness, wooziness or syncope/near syncope. No TIA or amaurosis fugax symptoms. No rapid irregular heartbeats, but she does have occasional beats. Nothing prolonged and nothing worrisome.   No PND, orthopnea or edema. No melena, hematochezia, hematuria, or epstaxis -- on Effient. Only occasionally has heavy menses. Usually not. She notes it more when she has to use Tylenol or Motrin for migraine headache.  No claudication.   She denied any further episodes like she did this spring when she came in for a quick follow-up.  ROS: A comprehensive was performed.Pertinent symptoms noted above  Review of Systems  Constitutional: Negative for malaise/fatigue and weight loss.  HENT: Negative for congestion.   Respiratory: Negative for cough and sputum production.   Gastrointestinal: Negative for blood in stool, heartburn and melena.  Genitourinary: Negative for hematuria.  Musculoskeletal: Negative for joint pain (R achilles tendon pain - slowed her down some).  Neurological: Positive for dizziness (Only notes this after strenuous exertion when she may be a little dehydrated.).  Endo/Heme/Allergies: Does not bruise/bleed easily.  Psychiatric/Behavioral: The patient is nervous/anxious (Less frequent panic attacks. Only short little anxiety spells, but usually controlled with deep breathing and relaxation.).   All other systems reviewed and are negative.  I have reviewed and (if needed) personally updated the patient's problem list, medications, allergies, past medical and surgical history, social and family history.  Past Medical History:  Diagnosis Date  . CAD S/P percutaneous coronary angioplasty 04/2013   - 100% D2, ~90% Cx-OM1; EF 45-50%  . Dyslipidemia, goal LDL below 70    Reduced to 5 mg Crestor  . Fainting spell   . History of pneumonia  ~2002   "walking PNA  . NSTEMI - S/P PCI and stenting x 2 of Circumflex and Diagonal Branch w/ DES 04/12/13 04/12/2013   Pathophysiology consistent with STEMI with occluded diagonal branch he  . Piriformis muscle pain    LLE  . Presence of drug coated stent in a Diagonal branch of the LAD coronary artery 04/12/2013   Xence EXpd DES 2.25 mm x 15 mm - post-dil to 2.4 mm  . Presence of drug coated stent in left circumflex coronary artery 04/12/2013   Xience EXpd DES 3.0 mm x 23 mm - post-dil to 3.25 mm    Past Surgical History:  Procedure Laterality Date  . CORONARY ANGIOPLASTY WITH STENT PLACEMENT  03/12/13   Xience Exp. 3.70mm x 23 mm (post-dil to 3.80mm) - OM1; Xience Exp. 2.25 mm x 15 mm - Diag 2 (post-dil 2.4 mm)  . FINGER FRACTURE SURGERY Left ~2008   "ring finger" (04/11/2013)  . LEFT HEART CATHETERIZATION WITH CORONARY ANGIOGRAM N/A 04/12/2013   Procedure: LEFT HEART CATHETERIZATION WITH CORONARY ANGIOGRAM;  Surgeon: Lorretta Harp, MD;  Location: [facility] CATH LAB;  Service: Cardiovascular;  Laterality: N/A;  . NM MYOVIEW LTD  05/30/2013   Low Risk nuclear stress test with small area of mild perfusion defect in the anterolateral wall. Normal function. -- Consistent with diagonal branch disease    Current Meds  Medication Sig  . ALPRAZolam (XANAX) 0.25 MG tablet TAKE 1 TABLET 3 TIMES A DAY AS NEEDED FOR ANXIETY  . prasugrel (EFFIENT) 5 MG TABS tablet Take 1 tablet (5 mg total) by mouth every other day.  . [DISCONTINUED] ALPRAZolam (XANAX) 0.25 MG tablet TAKE 1 TABLET 3 TIMES A DAY AS NEEDED FOR ANXIETY  . [DISCONTINUED] prasugrel (EFFIENT) 5 MG TABS tablet TAKE 1 TABLET (5 MG TOTAL) BY MOUTH DAILY. NEED OV.   Current Facility-Administered Medications for the 12/29/18 encounter (Office Visit) with Leonie Man, MD  Medication  . 0.9 %  sodium chloride infusion    Allergies  Allergen Reactions  . Crestor [Rosuvastatin]     Muscle aches at 10 mg daily, 5 mg daily, and 5 mg twice weekly  .  Lipitor [Atorvastatin]     Muscle aches     Social History   Tobacco Use  . Smoking status: Never Smoker  . Smokeless tobacco: Never Used  Substance Use Topics  . Alcohol use: No  . Drug use: No   Social History   Social History Narrative   Socially she is in her second marriage. She has 4 children ages 19 and 9 from her first marriage ages 33 and 29 for her second marriage. Has no tobacco history but she admits to a lifelong commitment to exercise and running competitively. She is eagerly and has been getting back into full training, and competitions.     family history includes Alcohol abuse in her mother; Hyperlipidemia in her father and mother; Hypertension in her father.  Wt Readings from Last 3 Encounters:  12/29/18 146 lb 6.4 oz (66.4 kg)  02/16/18 140 lb (63.5 kg)  02/04/18 141 lb 14.4 oz (64.4 kg)    PHYSICAL EXAM BP (!) 158/92   Pulse (!) 53   Ht 5'  5" (1.651 m)   Wt 146 lb 6.4 oz (66.4 kg)   BMI 24.36 kg/m  Physical Exam  Constitutional: She is oriented to person, place, and time. She appears well-developed and well-nourished. No distress.  Very healthy-appearing. Well-groomed.  HENT:  Head: Normocephalic and atraumatic.  Neck: Normal range of motion. Neck supple. No hepatojugular reflux and no JVD present. Carotid bruit is not present.  Cardiovascular: Normal rate, regular rhythm, normal heart sounds and intact distal pulses.  Occasional extrasystoles are present. PMI is not displaced. Exam reveals no gallop and no friction rub.  No murmur heard. Pulmonary/Chest: Effort normal and breath sounds normal. No respiratory distress. She has no wheezes. She has no rales.  Abdominal: Soft. Bowel sounds are normal. She exhibits no distension. There is no abdominal tenderness. There is no rebound.  Musculoskeletal: Normal range of motion.        General: No edema.  Neurological: She is alert and oriented to person, place, and time.  Psychiatric: She has a normal mood  and affect. Her behavior is normal. Judgment and thought content normal.  Nursing note and vitals reviewed.    Adult ECG Report  Rate: 53;  Rhythm: sinus bradycardia, sinus arrhythmia and Normal axis, intervals and durations.;   Narrative Interpretation: Normal stable EKG   Other studies Reviewed: Additional studies/ records that were reviewed today include:  Recent Labs:  Just had labs checked through work - will try to send results --> she reports that the labs were "pretty stable"   ASSESSMENT / Problem List Items Addressed This Visit    CAD (coronary artery disease), native coronary artery - NSTEMI - S/P PCI x 2 of Circumflex and Diag 2 w/ DES 04/12/13 - Primary (Chronic)    Severe two-vessel disease with DES PCI to diagonal and circumflex in the setting of non-STEMI. Negative Myoview. Clearly not symptomatic. Very reluctant to take any medications.  I would not use a beta-blocker because this would make her feel fatigued and she has resting bradycardia. Her blood pressure from today is way out of proportion from what she usually has.  She has been intolerant of any medication in the past. She refuses to take any type of statins, and refused PCSK9 inhibitors.  She is worried about how this will affect her competitive running.  At this point, I do not think we need to do any type of further evaluation for her, in fact I think we can just put her off her visits to every 18 months if since this will help her anxiety level. We will check a lipid panel just for baseline, although she probably would not want to take medication (perhaps we can consider Zetia or Vascepa).      Relevant Medications   aspirin EC 81 MG tablet   Other Relevant Orders   EKG 12-Lead   Lipid panel (Completed)   Comprehensive metabolic panel (Completed)   Dyslipidemia, goal LDL below 70 (Chronic)    See CAD segment.  At this point we will recheck lipid panel to determine what her new baseline is.  Non-statin  options other than PCSK9 inhibitors are probably not can be helpful.  However perhaps we can consider Zetia and or Vascepa.      Relevant Medications   aspirin EC 81 MG tablet   Other Relevant Orders   Lipid panel (Completed)   Comprehensive metabolic panel (Completed)   Long term current use of antithrombotics/antiplatelets (Chronic)    She is probably [place] out from her  non-STEMI that we are on borrowed time using Effient for prophylaxis, however I would like to have a decent amount of prophylaxis on board since she is so reluctant to do any other management. Plan: Alternate aspirin and Effient every other day.      Relevant Orders   Comprehensive metabolic panel (Completed)   Near syncope -- with orthostatic hypotension    With her vigorous activity and tendency to be dehydrated, would really not want to add a blood pressure medication because she has had vasovagal/heat related syncopal symptoms in the past.      Relevant Medications   aspirin EC 81 MG tablet   Other Relevant Orders   EKG 12-Lead   Non-ST elevated myocardial infarction (non-STEMI) (HCC) (Chronic)    She pretty much has PTSD from this episode.  She still has her Xanax which she uses for anxiety and to help her sleep.  Not unreasonable to continue to fill this prescription for her.  She was reluctant to use an SSRI. Follow-up echo showed reduced EF but the Myoview several months later showed normalized EF.  I do not think she has a low EF at this point. Not on beta-blocker because of fatigue.  Otherwise not on any other medications because of a history of orthostatic hypotension and vasovagal spells.      Relevant Medications   aspirin EC 81 MG tablet   Panic attacks (Chronic)    Using as needed Xanax.  Reluctant to try anything else such as SSRI or SNRI.      Relevant Medications   ALPRAZolam (XANAX) 0.25 MG tablet     PLAN: Robin Tapia for the most part indicates that she has extreme levels of anxiety  coming to a doctor's visit.  She does not understand the concept of CAD being a lifetime diagnosis.  She would like to be done with the episode and not abrupt think about it again, but unfortunately that simply not the case.  Her lipids are poorly controlled and she refuses to take any other medicines.  The very fact that she is not as active as she is is probably the reason she is doing so well. She really wants to come off medications, but I really think that the only thing she is been able tolerate has been the Effient. Plan for now will be to alternate every other day aspirin and Effient for secondary prevention.  Check lipid panel, and see if we can use this to control her to take some type of cholesterol medicine such as Vascepa or or Zetia.  She would not even try co-Q10 in the past.  We will push out follow-up visit to 18 months.  Current medicines are reviewed at length with the patient today. (+/- concerns) when can she stop Effient The following changes have been made:See below  Partially because of her unwillingness to take medications and her anxiety level, the visit took at least 40 to 45 minutes.  The entire time was spent in direct patient contact.  Patient Instructions  Medication Instructions:  ALTERNATE  EFFIENT AND ASPIRIN 74 MGEVERY OTHER DAY If you need a refill on your cardiac medications before your next appointment, please call your pharmacy.   Lab work:  CMP LIPID If you have labs (blood work) drawn today and your tests are completely normal, you will receive your results only by: Marland Kitchen MyChart Message (if you have MyChart) OR . A paper copy in the mail If you have any lab test that is  abnormal or we need to change your treatment, we will call you to review the results.  Testing/Procedures: NOT NEEDED  Follow-Up: At [facility], you and your health needs are our priority.  As part of our continuing mission to provide you with exceptional heart care, we have  created designated Provider Care Teams.  These Care Teams include your primary Cardiologist (physician) and Advanced Practice Providers (APPs -  Physician Assistants and Nurse Practitioners) who all work together to provide you with the care you need, when you need it. You will need a follow up appointment in 18 months.  Please call our office 2 months in advance to schedule this appointment.  You may see Glenetta Hew, MD or one of the following Advanced Practice Providers on your designated Care Team:   Rosaria Ferries, PA-C . Jory Sims, DNP, ANP  Any Other Special Instructions Will Be Listed Below (If Applicable).     Studies Ordered:   Orders Placed This Encounter  Procedures  . Lipid panel  . Comprehensive metabolic panel  . EKG 12-Lead     Glenetta Hew, M.D., M.S. Interventional Cardiologist   Pager # [phone] Phone # [phone] [address]. [place], [place] [address]

## 2018-12-29 NOTE — Patient Instructions (Signed)
Medication Instructions:  ALTERNATE  EFFIENT AND ASPIRIN 4 MGEVERY OTHER DAY If you need a refill on your cardiac medications before your next appointment, please call your pharmacy.   Lab work:  CMP LIPID If you have labs (blood work) drawn today and your tests are completely normal, you will receive your results only by: Marland Kitchen MyChart Message (if you have MyChart) OR . A paper copy in the mail If you have any lab test that is abnormal or we need to change your treatment, we will call you to review the results.  Testing/Procedures: NOT NEEDED  Follow-Up: At [facility], you and your health needs are our priority.  As part of our continuing mission to provide you with exceptional heart care, we have created designated Provider Care Teams.  These Care Teams include your primary Cardiologist (physician) and Advanced Practice Providers (APPs -  Physician Assistants and Nurse Practitioners) who all work together to provide you with the care you need, when you need it. You will need a follow up appointment in 18 months.  Please call our office 2 months in advance to schedule this appointment.  You may see Glenetta Hew, MD or one of the following Advanced Practice Providers on your designated Care Team:   Rosaria Ferries, PA-C . Jory Sims, DNP, ANP  Any Other Special Instructions Will Be Listed Below (If Applicable).

## 2018-12-30 ENCOUNTER — Telehealth: Payer: Self-pay | Admitting: Cardiology

## 2018-12-30 NOTE — Telephone Encounter (Signed)
Patient returned call, gave lab results. Attempted to notify patient that she should try medication, patient refused and will work on her diet.  Will notify MD and RN.

## 2018-12-30 NOTE — Telephone Encounter (Signed)
New Message   Patient calling to get Lab results.

## 2018-12-30 NOTE — Telephone Encounter (Signed)
Attempted to contact patient to give lab results. LVM

## 2018-12-30 NOTE — Telephone Encounter (Signed)
Yes, I was quite certain this was going to happen.  Glenetta Hew, MD

## 2018-12-31 NOTE — Assessment & Plan Note (Signed)
She pretty much has PTSD from this episode.  She still has her Xanax which she uses for anxiety and to help her sleep.  Not unreasonable to continue to fill this prescription for her.  She was reluctant to use an SSRI. Follow-up echo showed reduced EF but the Myoview several months later showed normalized EF.  I do not think she has a low EF at this point. Not on beta-blocker because of fatigue.  Otherwise not on any other medications because of a history of orthostatic hypotension and vasovagal spells.

## 2018-12-31 NOTE — Assessment & Plan Note (Signed)
See CAD segment.  At this point we will recheck lipid panel to determine what her new baseline is.  Non-statin options other than PCSK9 inhibitors are probably not can be helpful.  However perhaps we can consider Zetia and or Vascepa.

## 2018-12-31 NOTE — Assessment & Plan Note (Signed)
She is probably Robin Tapia out from her non-STEMI that we are on borrowed time using Effient for prophylaxis, however I would like to have a decent amount of prophylaxis on board since she is so reluctant to do any other management. Plan: Alternate aspirin and Effient every other day.

## 2018-12-31 NOTE — Assessment & Plan Note (Signed)
With her vigorous activity and tendency to be dehydrated, would really not want to add a blood pressure medication because she has had vasovagal/heat related syncopal symptoms in the past.

## 2018-12-31 NOTE — Assessment & Plan Note (Signed)
Severe two-vessel disease with DES PCI to diagonal and circumflex in the setting of non-STEMI. Negative Myoview. Clearly not symptomatic. Very reluctant to take any medications.  I would not use a beta-blocker because this would make her feel fatigued and she has resting bradycardia. Her blood pressure from today is way out of proportion from what she usually has.  She has been intolerant of any medication in the past. She refuses to take any type of statins, and refused PCSK9 inhibitors.  She is worried about how this will affect her competitive running.  At this point, I do not think we need to do any type of further evaluation for her, in fact I think we can just put her off her visits to every 18 months if since this will help her anxiety level. We will check a lipid panel just for baseline, although she probably would not want to take medication (perhaps we can consider Zetia or Vascepa).

## 2018-12-31 NOTE — Assessment & Plan Note (Signed)
Using as needed Xanax.  Reluctant to try anything else such as SSRI or SNRI.

## 2019-01-02 ENCOUNTER — Telehealth: Payer: Self-pay | Admitting: Cardiology

## 2019-01-02 NOTE — Telephone Encounter (Signed)
Patient states when she was in last week Dr. Ellyn Hack gave her a prescription for [facility].  However she has lost it.  She would like another script to be written for her.

## 2019-01-03 NOTE — Telephone Encounter (Signed)
Pt says she lost her Rx given to her for her Xanax and wanted to see if Dr. Ellyn Hack willing to write her another one and she will pick up when ready.

## 2019-01-03 NOTE — Telephone Encounter (Signed)
Follow up     Patient requesting call regarding  Xanax prescription

## 2019-01-04 NOTE — Telephone Encounter (Signed)
I guess we can give her another print out of her prescription.  I can see tomorrow in clinic  Glenetta Hew, MD

## 2019-01-06 NOTE — Telephone Encounter (Signed)
Pt called again asking if new rx is ready. Please call to advise

## 2019-01-09 NOTE — Telephone Encounter (Signed)
Won't be back in office until after next week - can sign Rx then.  Glenetta Hew, MD

## 2019-01-11 NOTE — Telephone Encounter (Signed)
Patient aware will contact when prescription has been signed

## 2019-01-18 MED ORDER — PRASUGREL HCL 5 MG PO TABS: 5.0000 mg | ORAL_TABLET | ORAL | 3 refills | Status: DC

## 2019-01-18 MED ORDER — ALPRAZOLAM 0.25 MG PO TABS: ORAL_TABLET | ORAL | 2 refills | Status: DC

## 2019-01-18 NOTE — Telephone Encounter (Addendum)
DR HARDING SIGNED PRESCRIPTION- LEFT MESSAGE FOR  PATIENT   CAN PICK  TOMORROW

## 2019-05-24 ENCOUNTER — Telehealth: Payer: Self-pay | Admitting: Cardiology

## 2019-05-24 NOTE — Telephone Encounter (Signed)
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 Please call.

## 2019-05-24 NOTE — Telephone Encounter (Addendum)
Spoke to patient. 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  She would need stress test every 2 years or  Letter from the cardiology- stating she was  Okay for driving.    informed patient will defer to Dr Ellyn Hack and contact her with response.  Patient stated if she needs stress test , She would not do one because she has not had any issues since  Her episode.  she states I just want drive this year.  RN  Informed patient that she could make that decision once she receives Dr Ellyn Hack response.   Patient  GAVE RN THE BELOW INFORMATION: If letter is written fax # Anne Hahn FNP- [phone]   [email or web address]  -- [email or web address]   due by July 28,2020

## 2019-05-24 NOTE — Telephone Encounter (Signed)
LEFT MESSAGE TO CALL BACK -  NEED LITTLE MORE INFO  1) IS THIS LOCAL DRIVE? 2) HOW MANY PEOPLE ON BUS ( CHILDREN OR ADULTS)?30  IS THIS A D.O.T. REQUEST?

## 2019-05-25 NOTE — Telephone Encounter (Signed)
Will work on letter to be ready by Monday 7/20.  Glenetta Hew, MD

## 2019-05-26 ENCOUNTER — Encounter: Payer: Self-pay | Admitting: *Deleted

## 2019-05-26 NOTE — Telephone Encounter (Signed)
Patient aware that Dr Ellyn Hack will compose a letter for her.  informed patient to contact office on Tuesday due to RN WILL BE out of the office to see if letter is completed.   information can be faxed as requested - the information is in notation.

## 2019-05-31 ENCOUNTER — Encounter: Payer: Self-pay | Admitting: Cardiology

## 2019-05-31 NOTE — Telephone Encounter (Signed)
Letter is in the letters section

## 2019-05-31 NOTE — Telephone Encounter (Signed)
Follow up    Patient is calling to follow up on whether or not letter has been sent to [employer]. Please call to discuss.

## 2019-05-31 NOTE — Telephone Encounter (Signed)
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

## 2019-05-31 NOTE — Progress Notes (Signed)
Patient requires letter for [facility] licensure.  Robin Hew, Robin Tapia     Robin Tapia is a 53 year old woman with a history of non-ST elevation MI two-vessel PCI in June 2014.  Stents placed in both her Circumflex and 2nd Diagonal branch at that time, and is been relatively stable from a cardiac standpoint ever since.  She had a surveillance Myoview stress test done shortly after her MI which revealed no evidence of any ischemia.  She was seen in February 2020 in follow-up.  She was doing very well, continues to be competitive with long distance running anywhere from 5K-10 K as well as even half marathons.  Besides occasional episodes of white coat related hypertension, she has been doing well with no major complaints over the last couple years.  She is now seeking [employer] licensure.  At this point I do not see any clinical indication for her to need to have a stress test done as she continues to be a competitive distance runner.  She has not had any anginal symptoms since then.  No syncope or near syncope.

## 2019-05-31 NOTE — Telephone Encounter (Signed)
Letter dictated.  Pls apologize for me -- I accidentally deleted my reminder. Glenetta Hew, MD

## 2019-06-01 NOTE — Telephone Encounter (Signed)
Spoke to patient Dr.Harding said he was sorry he deleted his reminder to do your letter.Advised letter faxed to Jettie Booze FNP this morning at fax # [phone].Also a copy of letter mailed to you.

## 2019-08-11 ENCOUNTER — Other Ambulatory Visit: Payer: Self-pay | Admitting: Cardiology

## 2019-11-24 ENCOUNTER — Other Ambulatory Visit: Payer: Self-pay | Admitting: Obstetrics and Gynecology

## 2019-11-24 DIAGNOSIS — Z853 Personal history of malignant neoplasm of breast: Secondary | ICD-10-CM

## 2019-11-29 ENCOUNTER — Other Ambulatory Visit: Payer: Self-pay | Admitting: Obstetrics and Gynecology

## 2019-11-29 DIAGNOSIS — N6489 Other specified disorders of breast: Secondary | ICD-10-CM

## 2019-12-27 DIAGNOSIS — Z01419 Encounter for gynecological examination (general) (routine) without abnormal findings: Secondary | ICD-10-CM | POA: Diagnosis not present

## 2019-12-27 DIAGNOSIS — Z6822 Body mass index (BMI) 22.0-22.9, adult: Secondary | ICD-10-CM | POA: Diagnosis not present

## 2019-12-27 DIAGNOSIS — Z1152 Encounter for screening for COVID-19: Secondary | ICD-10-CM | POA: Diagnosis not present

## 2020-01-18 ENCOUNTER — Other Ambulatory Visit: Payer: Self-pay

## 2020-01-18 ENCOUNTER — Other Ambulatory Visit: Payer: Self-pay | Admitting: Obstetrics and Gynecology

## 2020-01-18 ENCOUNTER — Ambulatory Visit
Admission: RE | Admit: 2020-01-18 | Discharge: 2020-01-18 | Disposition: A | Discharge status: Home / Self Care | Payer: BC Managed Care – PPO | Source: Ambulatory Visit | Attending: Obstetrics and Gynecology | Admitting: Obstetrics and Gynecology

## 2020-01-18 DIAGNOSIS — N6320 Unspecified lump in the left breast, unspecified quadrant: Secondary | ICD-10-CM

## 2020-01-18 DIAGNOSIS — N6489 Other specified disorders of breast: Secondary | ICD-10-CM

## 2020-01-18 DIAGNOSIS — R922 Inconclusive mammogram: Secondary | ICD-10-CM | POA: Diagnosis not present

## 2020-01-18 DIAGNOSIS — N6321 Unspecified lump in the left breast, upper outer quadrant: Secondary | ICD-10-CM | POA: Diagnosis not present

## 2020-01-18 DIAGNOSIS — N6002 Solitary cyst of left breast: Secondary | ICD-10-CM | POA: Diagnosis not present

## 2020-01-25 ENCOUNTER — Ambulatory Visit
Admission: RE | Admit: 2020-01-25 | Discharge: 2020-01-25 | Disposition: A | Discharge status: Home / Self Care | Payer: BC Managed Care – PPO | Source: Ambulatory Visit | Attending: Obstetrics and Gynecology | Admitting: Obstetrics and Gynecology

## 2020-01-25 ENCOUNTER — Other Ambulatory Visit: Payer: Self-pay

## 2020-01-25 DIAGNOSIS — N6320 Unspecified lump in the left breast, unspecified quadrant: Secondary | ICD-10-CM

## 2020-01-25 DIAGNOSIS — D242 Benign neoplasm of left breast: Secondary | ICD-10-CM | POA: Diagnosis not present

## 2020-01-25 DIAGNOSIS — N6012 Diffuse cystic mastopathy of left breast: Secondary | ICD-10-CM | POA: Diagnosis not present

## 2020-01-25 DIAGNOSIS — N6321 Unspecified lump in the left breast, upper outer quadrant: Secondary | ICD-10-CM | POA: Diagnosis not present

## 2020-06-24 ENCOUNTER — Other Ambulatory Visit: Payer: Self-pay

## 2020-06-24 ENCOUNTER — Encounter: Payer: Self-pay | Admitting: Cardiology

## 2020-06-24 ENCOUNTER — Ambulatory Visit: Payer: BC Managed Care – PPO | Admitting: Cardiology

## 2020-06-24 VITALS — BP 110/74 | HR 73 | Ht 65.0 in | Wt 139.0 lb

## 2020-06-24 DIAGNOSIS — I2510 Atherosclerotic heart disease of native coronary artery without angina pectoris: Secondary | ICD-10-CM | POA: Diagnosis not present

## 2020-06-24 DIAGNOSIS — E785 Hyperlipidemia, unspecified: Secondary | ICD-10-CM | POA: Diagnosis not present

## 2020-06-24 DIAGNOSIS — R55 Syncope and collapse: Secondary | ICD-10-CM | POA: Diagnosis not present

## 2020-06-24 DIAGNOSIS — I214 Non-ST elevation (NSTEMI) myocardial infarction: Secondary | ICD-10-CM | POA: Diagnosis not present

## 2020-06-24 DIAGNOSIS — Z9861 Coronary angioplasty status: Secondary | ICD-10-CM

## 2020-06-24 NOTE — Patient Instructions (Addendum)
Medication Instructions:  The current medical regimen is effective;  continue present plan and medications.  *If you need a refill on your cardiac medications before your next appointment, please call your pharmacy*   Follow-Up: At [facility], you and your health needs are our priority.  As part of our continuing mission to provide you with exceptional heart care, we have created designated Provider Care Teams.  These Care Teams include your primary Cardiologist (physician) and Advanced Practice Providers (APPs -  Physician Assistants and Nurse Practitioners) who all work together to provide you with the care you need, when you need it.  We recommend signing up for the patient portal called "MyChart".  Sign up information is provided on this After Visit Summary.  MyChart is used to connect with patients for Virtual Visits (Telemedicine).  Patients are able to view lab/test results, encounter notes, upcoming appointments, etc.  Non-urgent messages can be sent to your provider as well.   To learn more about what you can do with MyChart, go to [email or web address].    Your next appointment:   18 month(s)  The format for your next appointment:   In Person  Provider:   Glenetta Hew, MD

## 2020-06-24 NOTE — Progress Notes (Signed)
 Primary Care Provider: Burchette, Bruce W, MD Cardiologist: David Harding, MD Electrophysiologist: None  Clinic Note: Chief Complaint  Patient presents with  . Follow-up    Doing well  . Coronary Artery Disease    No angina   HPI:    Robin Tapia is a 53 y.o. female (avid distance runner) with a PMH notable for two-vessel CAD-PCI in the setting of non-STEMI in June 2014 who presents today for 18-month follow-up.  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  After one of her raises back in June 2014, she suffered a non-ST elevation MI and was found to have severe two-vessel disease -> PCI of LCx and 2nd Diag. -->  Following discharge she had several significant episodes of PTSD/panic attacks with near syncope.  These is subsequently resolved, but she still has pretty significant PTSD and panic attacks. 1. CAD: NSTEMI 04/2013 - 2V CAD - DES PCI - 2 DES-LCx, 1 DES 2nd diag --> on maintenance ASA 81 mg & 10 mg Prasugrel (every other day dosing), No BP or Lipid meds 2. Hyperlipidemia - STATIN, ZETIA & even PCSK9-I INTOLERANT -- will not take Lipid medications (says that they keep her from being able to run) 3. PTSD/ Anxiety d/o - has PRN Xanax (did not tolerate SSRI).   Robin Tapia was last seen in Feb 2020 -well.  No problems.  No chest pain or pressure.  She had injured her ankle about 2 months prior to this visit and had been "laid up "not able to exercise.  Blood pressure check was high, but not usually that way at home.  No major complaints.  No further anxiety attacks.  Recent Hospitalizations: None  Reviewed  CV studies:    The following studies were reviewed today: (if available, images/films reviewed: From Epic Chart or Care Everywhere) . None:  Interval History:   Robin Tapia returns for her 18-month follow-up stating that she is doing fine.  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  She indicates that her team continue to train during the Covid lockdown.  They met "incognito.  She has also been doing individual training with some athletes-one of them was a former [employer] who clearly had worse PTSD than she did because he ended up with a drug overdose.  This bothered her some.  She definitely wants to avoid taking Xanax as a result of this.  If she overexerts self she may get a little bit dizzy and lightheaded, but usually if she stays hydrated this is not a problem.  Otherwise really she denies any symptoms.  She refuses to even talk about her cholesterol levels because based anything she takes in her mind will affect her running.  She said that PCSK9 habitus kept her from "lift her legs up onto the curb "when running.  CV Review of Symptoms (Summary) Cardiovascular ROS: no chest pain or dyspnea on exertion negative for - edema, irregular heartbeat, orthopnea, palpitations, paroxysmal nocturnal dyspnea, rapid heart rate, shortness of breath or Syncope/near syncope, TIA/amaurosis fugax, claudication  The patient does not have symptoms concerning for COVID-19 infection (fever, chills, cough, or new shortness of breath).  The patient is practicing social distancing & Masking.   She has no intention of doing Covid vaccine   REVIEWED OF SYSTEMS   Review of Systems  Constitutional: Negative for malaise/fatigue and weight loss.  HENT: Negative for congestion and nosebleeds.     Respiratory: Negative for shortness of breath.   Gastrointestinal: Negative for blood in stool and melena.  Genitourinary: Negative for hematuria.  Musculoskeletal: Negative for falls and joint pain.  Neurological: Positive for dizziness (If she exercises when inadequately hydrated or in significant heat). Negative for focal weakness and weakness.  Endo/Heme/Allergies: Does not bruise/bleed easily.  Psychiatric/Behavioral: Negative for memory loss. The patient is  nervous/anxious and has insomnia.    I have reviewed and (if needed) personally updated the patient's problem list, medications, allergies, past medical and surgical history, social and family history.   PAST MEDICAL HISTORY   Past Medical History:  Diagnosis Date  . CAD S/P percutaneous coronary angioplasty 04/2013   - 100% D2, ~90% Cx-OM1; EF 45-50%  . Dyslipidemia, goal LDL below 70    Reduced to 5 mg Crestor  . Fainting spell   . History of pneumonia ~2002   "walking PNA  . NSTEMI - S/P PCI and stenting x 2 of Circumflex and Diagonal Branch w/ DES 04/12/13 04/12/2013   Pathophysiology consistent with STEMI with occluded diagonal branch he  . Piriformis muscle pain    LLE  . Presence of drug coated stent in a Diagonal branch of the LAD coronary artery 04/12/2013   Xence EXpd DES 2.25 mm x 15 mm - post-dil to 2.4 mm  . Presence of drug coated stent in left circumflex coronary artery 04/12/2013   Xience EXpd DES 3.0 mm x 23 mm - post-dil to 3.25 mm    PAST SURGICAL HISTORY   Past Surgical History:  Procedure Laterality Date  . CORONARY ANGIOPLASTY WITH STENT PLACEMENT  03/12/13   Xience Exp. 3.71m x 23 mm (post-dil to 3.259m - OM1; Xience Exp. 2.25 mm x 15 mm - Diag 2 (post-dil 2.4 mm)  . FINGER FRACTURE SURGERY Left ~2008   "ring finger" (04/11/2013)  . LEFT HEART CATHETERIZATION WITH CORONARY ANGIOGRAM N/A 04/12/2013   Procedure: LEFT HEART CATHETERIZATION WITH CORONARY ANGIOGRAM;  Surgeon: JoLorretta HarpMD;  [facility];  Service: Cardiovascular;  Laterality: N/A;  . NM MYOVIEW LTD  05/30/2013   Low Risk nuclear stress test with small area of mild perfusion defect in the anterolateral wall. Normal function. -- Consistent with diagonal branch disease    MEDICATIONS/ALLERGIES   Current Meds  Medication Sig  . ALPRAZolam (XANAX) 0.25 MG tablet TAKE 1 TABLET 3 TIMES A DAY AS NEEDED FOR ANXIETY  . aspirin EC 81 MG tablet Take 1 tablet (81 mg total) by mouth every other  day.  . prasugrel (EFFIENT) 5 MG TABS tablet TAKE 1 TABLET EVERY DAY   Current Facility-Administered Medications for the 06/24/20 encounter (Office Visit) with HaLeonie ManMD  Medication  . 0.9 %  sodium chloride infusion    Allergies  Allergen Reactions  . Crestor [Rosuvastatin]     Muscle aches at 10 mg daily, 5 mg daily, and 5 mg twice weekly  . Lipitor [Atorvastatin]     Muscle aches     SOCIAL HISTORY/FAMILY HISTORY   Reviewed in Epic:  Pertinent findings: Still exercise routinely.  [place] [employer], labs   Wt Readings from Last 3 Encounters:  06/24/20 139 lb (63 kg)  12/29/18 146 lb 6.4 oz (66.4 kg)  02/16/18 140 lb (63.5 kg)    Physical Exam: BP 110/74   Pulse 73   Ht 5' 5" (1.651 m)   Wt 139 lb (63 kg)   LMP 01/24/2018  BMI 23.13 kg/m  Physical Exam Constitutional:      General: She is not in acute distress.    Appearance: Normal appearance. She is normal weight. She is not ill-appearing.  HENT:     Head: Normocephalic and atraumatic.  Neck:     Vascular: No carotid bruit or JVD.  Cardiovascular:     Rate and Rhythm: Normal rate and regular rhythm.     Pulses: Normal pulses.     Heart sounds: Normal heart sounds. No murmur heard.   Pulmonary:     Effort: Pulmonary effort is normal. No respiratory distress.     Breath sounds: Normal breath sounds.  Abdominal:     General: Abdomen is flat. Bowel sounds are normal. There is no distension.     Palpations: Abdomen is soft. There is no mass.  Musculoskeletal:        General: No swelling. Normal range of motion.     Cervical back: Normal range of motion and neck supple.  Neurological:     General: No focal deficit present.     Mental Status: She is alert and oriented to person, place, and time.     Comments: Pretty anxious  Psychiatric:        Mood and Affect: Mood normal.        Behavior: Behavior normal.        Thought Content: Thought content normal.         Judgment: Judgment normal.        Adult ECG Report  Rate: 73 ;  Rhythm: normal sinus rhythm and nonspecific ST-T wave changes.;   Narrative Interpretation: Relatively normal EKG.  Recent Labs:   Lab Results  Component Value Date   CHOL 222 (H) 12/29/2018   HDL 65 12/29/2018   LDLCALC 138 (H) 12/29/2018   TRIG 93 12/29/2018   CHOLHDL 3.4 12/29/2018   Lab Results  Component Value Date   CREATININE 0.69 12/29/2018   BUN 11 12/29/2018   NA 140 12/29/2018   K 5.1 12/29/2018   CL 104 12/29/2018   CO2 22 12/29/2018   Lab Results  Component Value Date   TSH 1.681 05/02/2013    ASSESSMENT/PLAN    Problem List Items Addressed This Visit    CAD (coronary artery disease), native coronary artery - NSTEMI - S/P PCI x 2 of Circumflex and Diag 2 w/ DES 04/12/13 - Primary (Chronic)    7 years out from her two-vessel PCI.  Has not had any further angina.  She had negative Myoview and follow-up.  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  Not on any medications as far as blood pressure or cholesterol concern.  She is on alternating every other day dose of aspirin versus prasugrel tolerating it well.  Pretty much per her request, she does not want change things      Non-ST elevated myocardial infarction (non-STEMI) (HCC) (Chronic)    Overall quite traumatic experience for relatively young and otherwise healthy woman.  She really has not had more those panic attacks per se there with the extent as it was when she first left the hospital, but she still does have anxiety related palpitations and dizziness and lightheadedness.  It would probably reasonable to think about a nonshort acting benzodiazepine as a way to treat her anxiety.  Will defer to PCP and or psychiatry.      Relevant Orders   EKG 12-Lead (Completed)   Dyslipidemia, goal LDL below 70 (Chronic)      We even tried PCSK9 inhibitors with her, and she came up with a reason not to take it.  We had a frank  discussion about this being the major risk factor she has.  Since were not treated, and also the reason to check labs.  Her PCP will likely be checking labs.  1 consideration would be Vascepa since that should have no symptoms from muscular standpoint      Near syncope -- with orthostatic hypotension    She has a tendency to be very active and vigorous even in heat.  If she does not hydrate well enough, she had a tendency to have near syncope, especially when she was on blood pressure medications.  Therefore we are allowing for no BP meds and even the potential for mild permissive hypertension          COVID-19 Education: The signs and symptoms of COVID-19 were discussed with the patient and how to seek care for testing (follow up with PCP or arrange E-visit).   The importance of social distancing and COVID-19 vaccination was discussed today.  I spent a total of 18 minutes in direct patient consultation.  Additional time spent with chart review  / charting (studies, outside notes, etc): 10 Total Time: 28min   Current medicines are reviewed at length with the patient today.  (+/- concerns) n/a  Notice: This dictation was prepared with Dragon dictation along with smaller phrase technology. Any transcriptional errors that result from this process are unintentional and may not be corrected upon review.  Patient Instructions / Medication Changes & Studies & Tests Ordered   Patient Instructions  Medication Instructions:  The current medical regimen is effective;  continue present plan and medications.  *If you need a refill on your cardiac medications before your next appointment, please call your pharmacy*   Follow-Up: At [facility], you and your health needs are our priority.  As part of our continuing mission to provide you with exceptional heart care, we have created designated Provider Care Teams.  These Care Teams include your primary Cardiologist (physician) and Advanced  Practice Providers (APPs -  Physician Assistants and Nurse Practitioners) who all work together to provide you with the care you need, when you need it.  We recommend signing up for the patient portal called "MyChart".  Sign up information is provided on this After Visit Summary.  MyChart is used to connect with patients for Virtual Visits (Telemedicine).  Patients are able to view lab/test results, encounter notes, upcoming appointments, etc.  Non-urgent messages can be sent to your provider as well.   To learn more about what you can do with MyChart, go to [email or web address].    Your next appointment:   18 month(s)  The format for your next appointment:   In Person  Provider:   David Harding, MD     Studies Ordered:   Orders Placed This Encounter  Procedures  . EKG 12-Lead     David Harding, M.D., M.S. Interventional Cardiologist   Pager # [phone] Phone # [phone] [address] [place], [place] [address]   Thank you for choosing [facility]!!    

## 2020-07-02 ENCOUNTER — Encounter: Payer: Self-pay | Admitting: Cardiology

## 2020-07-02 NOTE — Assessment & Plan Note (Signed)
We even tried PCSK9 inhibitors with her, and she came up with a reason not to take it.  We had a frank discussion about this being the major risk factor she has.  Since were not treated, and also the reason to check labs.  Her PCP will likely be checking labs.  1 consideration would be Vascepa since that should have no symptoms from muscular standpoint

## 2020-07-02 NOTE — Assessment & Plan Note (Signed)
She has a tendency to be very active and vigorous even in heat.  If she does not hydrate well enough, she had a tendency to have near syncope, especially when she was on blood pressure medications.  Therefore we are allowing for no BP meds and even the potential for mild permissive hypertension

## 2020-07-02 NOTE — Assessment & Plan Note (Signed)
7 years out from her two-vessel PCI.  Has not had any further angina.  She had negative Myoview and follow-up.  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  Not on any medications as far as blood pressure or cholesterol concern.  She is on alternating every other day dose of aspirin versus prasugrel tolerating it well.  Pretty much per her request, she does not want change things

## 2020-07-02 NOTE — Assessment & Plan Note (Signed)
Overall quite traumatic experience for relatively young and otherwise healthy woman.  She really has not had more those panic attacks per se there with the extent as it was when she first left the hospital, but she still does have anxiety related palpitations and dizziness and lightheadedness.  It would probably reasonable to think about a nonshort acting benzodiazepine as a way to treat her anxiety.  Will defer to PCP and or psychiatry.

## 2020-08-06 IMAGING — MG DIGITAL DIAGNOSTIC BILAT W/ TOMO W/ CAD
8 of 14 series · 8 of 40 positions shown · non-contrast
Comparison: 12/20/2018 and earlier

CLINICAL DATA: Two year followup for asymmetry in the RIGHT breast.

EXAM:
DIGITAL DIAGNOSTIC BILATERAL MAMMOGRAM WITH CAD AND TOMO
ULTRASOUND LEFT BREAST

[L MLO synth-2D (1 of 2)]
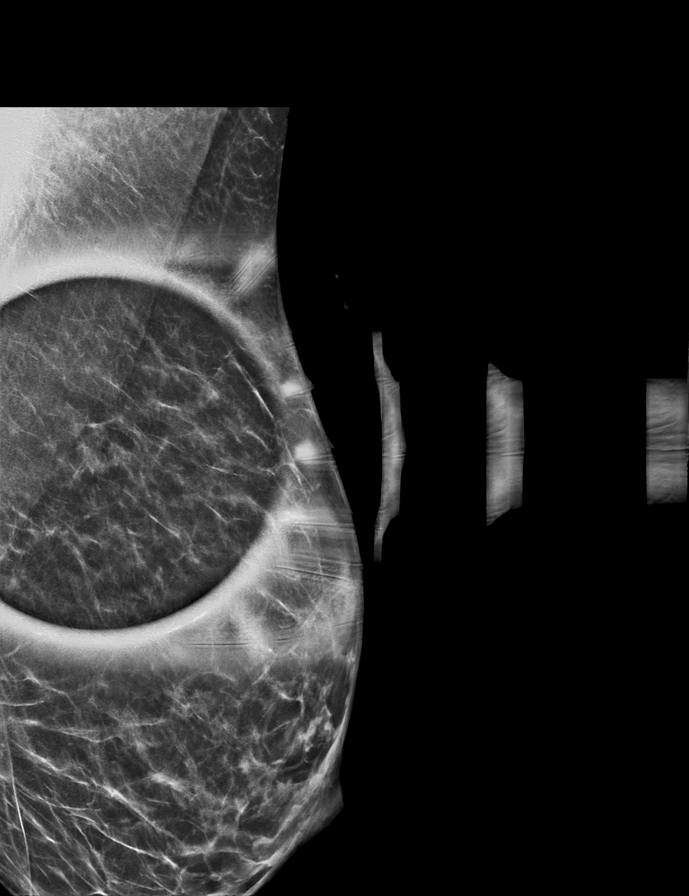

[L CC synth-2D]
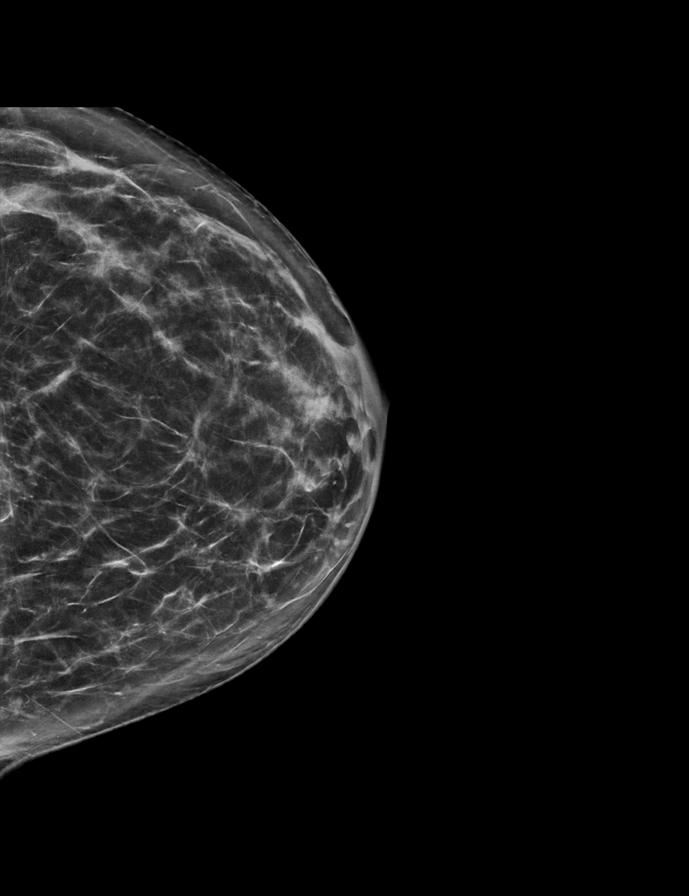

[R CC synth-2D]
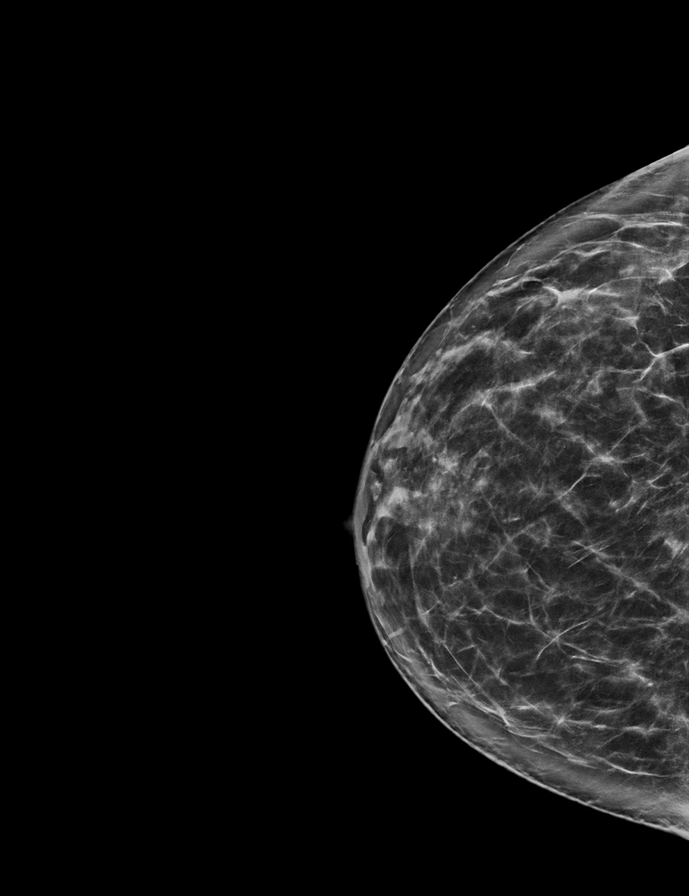

[L XCCL synth-2D (1 of 2)]
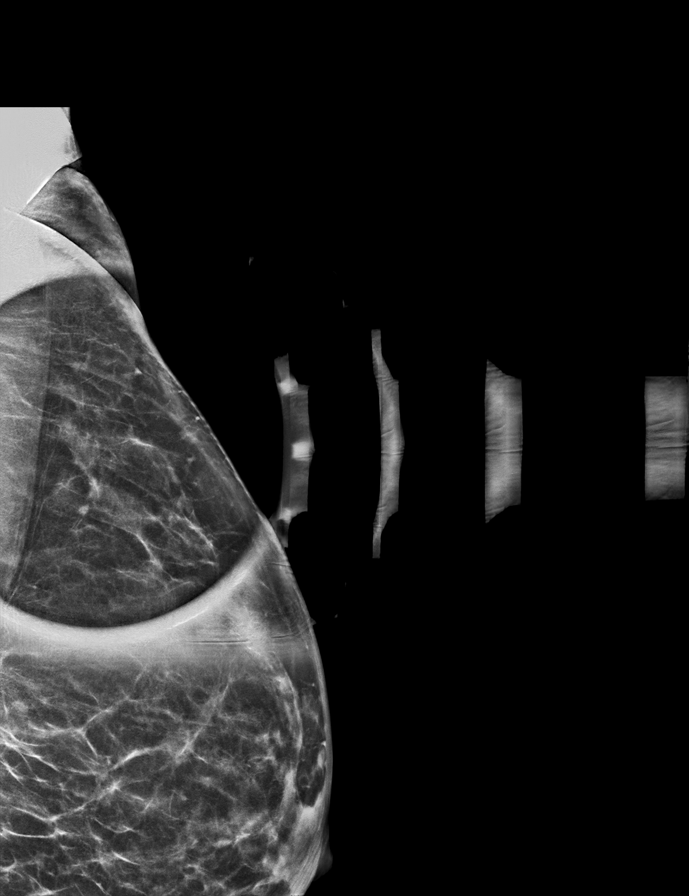

[L XCCL synth-2D (2 of 2)]
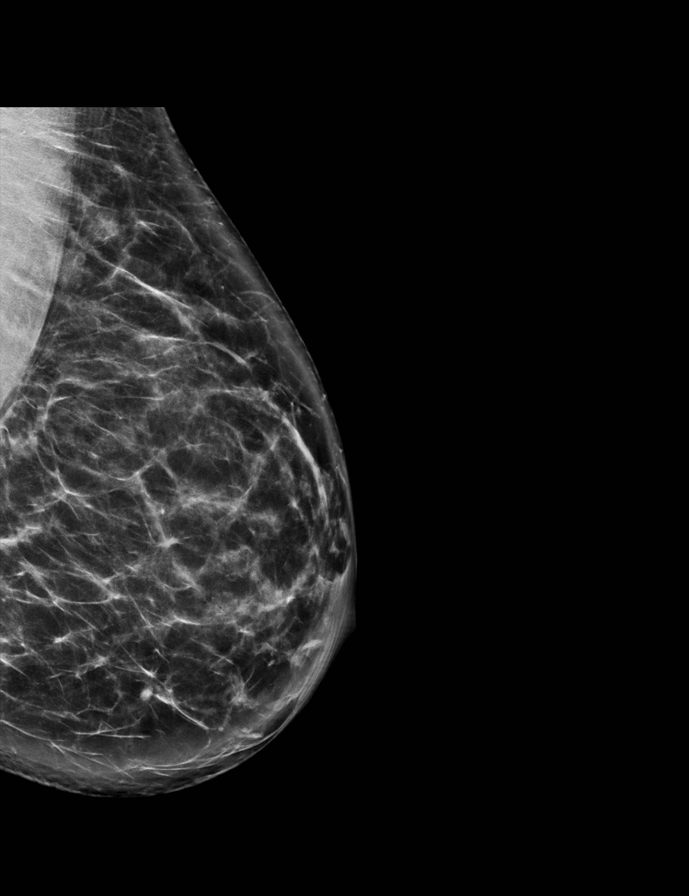

[R MLO synth-2D]
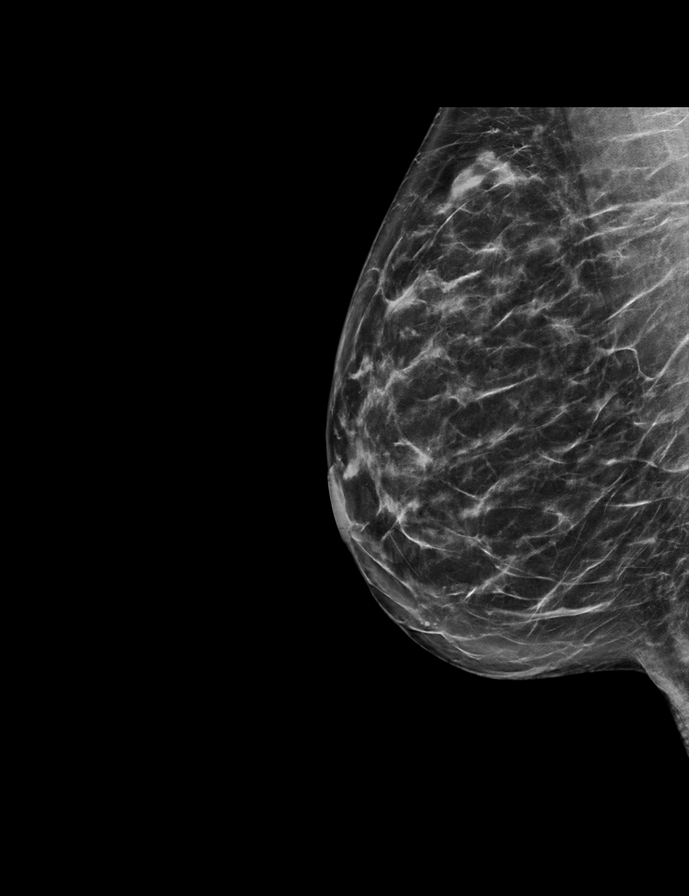

[L MLO synth-2D (2 of 2)]
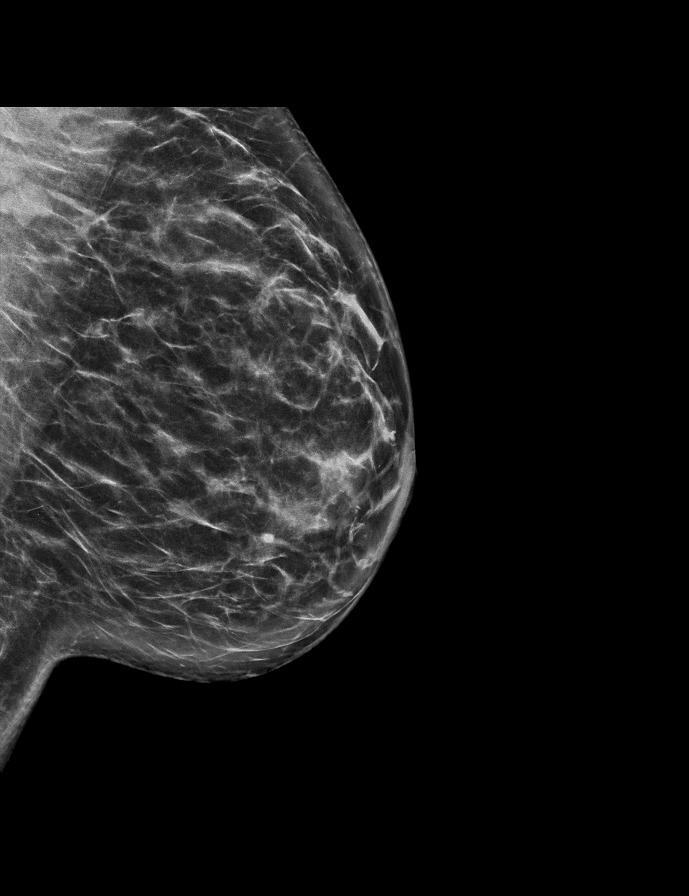

[L MLO tomo · tomo slice 31/60.0]
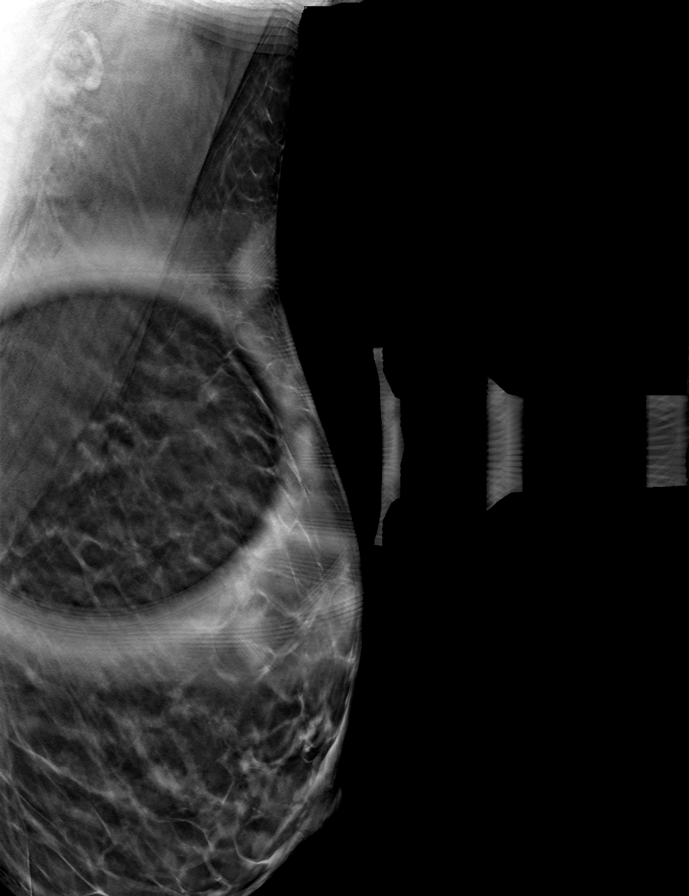

[8 of 40 positions shown; findings below may reference images not displayed]

ACR Breast Density Category c: The breast tissue is heterogeneously
dense, which may obscure small masses.
FINDINGS: Right breast:

Mammogram: Asymmetric density without masslike features or
distortion in the superior portion of the RIGHT breast appears
similar compared to multiple prior studies. No sonographic correlate
is identified for this mass previous diagnostic evaluation. No new
or suspicious findings in the RIGHT breast. Mammographic images were
processed with CAD.

Left breast:

Mammogram: A lobulated mass is identified in the UPPER-OUTER
QUADRANT of the LEFT breast and confirmed on spot compression views.
Mammographic images were processed with CAD.

Physical Exam: I palpate no abnormality in the UPPER-OUTER QUADRANT
of the LEFT breast.

Ultrasound: Targeted ultrasound is performed, showing adjacent
simple cysts in the 3 o'clock location 10 centimeters from nipple
measuring 1.4 x 1.6 x 0.3 centimeters.

In the 2 o'clock location 9 centimeters from the nipple, there is a
parallel oval hypoechoic mass with angular margins measuring 0.8 x
0.3 by 0.7 centimeters. No associated internal blood flow.

Also in the 2 o'clock location 9 centimeters from the nipple, a
second small hypoechoic oval mass with angular margins is 0.5 x
x 0.7 centimeters. No internal blood flow identified on Doppler
evaluation. Measured together, these lesions are 2.2 centimeters.

Evaluation of the LEFT axilla is negative for adenopathy.
IMPRESSION: 1. Stable asymmetry in the superior portion of the RIGHT breast
consistent with benign breast parenchyma. Given the long-term
stability, no specific follow-up is needed.
2. Simple cysts in the 3 o'clock location of the LEFT breast.
3. Two small indeterminate masses in the 2 o'clock location of the
LEFT breast 9 centimeters from the nipple, warranting tissue
diagnosis. We discussed some concerns the patient has regarding the
biopsy. She has a history of heart attack and reports significant
anxiety. We discussed possible anxiolytic medication for the
procedure. The patient agrees to schedule the biopsies today.
4. If the patient decides against biopsy, I would recommend a
follow-up LEFT diagnostic mammogram and ultrasound in 6 months.

RECOMMENDATION:
Recommend ultrasound-guided core biopsy of each of the 2 small
masses in the 2 o'clock location of the LEFT breast.

I have discussed the findings and recommendations with the patient.
If applicable, a reminder letter will be sent to the patient
regarding the next appointment.

BI-RADS CATEGORY  4: Suspicious.

## 2020-08-13 ENCOUNTER — Other Ambulatory Visit: Payer: Self-pay | Admitting: Cardiology

## 2020-08-13 MED ORDER — ALPRAZOLAM 0.25 MG PO TABS: ORAL_TABLET | ORAL | 2 refills | Status: DC

## 2020-08-13 NOTE — Telephone Encounter (Signed)
°*  STAT* If patient is at the pharmacy, call can be transferred to refill team.   1. Which medications need to be refilled? (please list name of each medication and dose if known) ALPRAZolam (XANAX) 0.25 MG tablet  2. Which pharmacy/location (including street and city if local pharmacy) is medication to be sent to? [id number] - , [place] - [address] AT [address]  3. Do they need a 30 day or 90 day supply? 90 day supply

## 2020-08-13 NOTE — Telephone Encounter (Signed)
I just saw her-- 30 day should be fine.  If we can, do 2 x refills.  Glenetta Hew, MD

## 2020-08-13 NOTE — Telephone Encounter (Signed)
Called pharmacy.  RN spoke to pharmacist . Verbal  Order for  Medication Given   Quantity #30 refill x 2.

## 2020-08-16 ENCOUNTER — Other Ambulatory Visit: Payer: Self-pay | Admitting: Cardiology

## 2020-08-16 NOTE — Telephone Encounter (Signed)
Rx has been sent to the pharmacy electronically. ° °

## 2020-09-16 DIAGNOSIS — Z20822 Contact with and (suspected) exposure to covid-19: Secondary | ICD-10-CM | POA: Diagnosis not present

## 2020-09-16 DIAGNOSIS — Z03818 Encounter for observation for suspected exposure to other biological agents ruled out: Secondary | ICD-10-CM | POA: Diagnosis not present

## 2020-11-05 DIAGNOSIS — Z1152 Encounter for screening for COVID-19: Secondary | ICD-10-CM | POA: Diagnosis not present

## 2020-11-05 DIAGNOSIS — Z20822 Contact with and (suspected) exposure to covid-19: Secondary | ICD-10-CM | POA: Diagnosis not present

## 2020-12-02 DIAGNOSIS — Z20822 Contact with and (suspected) exposure to covid-19: Secondary | ICD-10-CM | POA: Diagnosis not present

## 2020-12-02 DIAGNOSIS — Z8616 Personal history of COVID-19: Secondary | ICD-10-CM | POA: Diagnosis not present

## 2021-02-04 DIAGNOSIS — Z8616 Personal history of COVID-19: Secondary | ICD-10-CM | POA: Diagnosis not present

## 2021-02-04 DIAGNOSIS — Z029 Encounter for administrative examinations, unspecified: Secondary | ICD-10-CM | POA: Diagnosis not present

## 2021-02-04 DIAGNOSIS — Z20822 Contact with and (suspected) exposure to covid-19: Secondary | ICD-10-CM | POA: Diagnosis not present

## 2021-02-28 ENCOUNTER — Other Ambulatory Visit: Payer: Self-pay | Admitting: Cardiology

## 2021-02-28 ENCOUNTER — Telehealth: Payer: Self-pay | Admitting: Cardiology

## 2021-02-28 MED ORDER — ALPRAZOLAM 0.25 MG PO TABS: ORAL_TABLET | ORAL | 2 refills | Status: DC

## 2021-02-28 NOTE — Telephone Encounter (Signed)
*  STAT* If patient is at the pharmacy, call can be transferred to refill team.   1. Which medications need to be refilled? (please list name of each medication and dose if known) ALPRAZolam (XANAX) 0.25 MG tablet  2. Which pharmacy/location (including street and city if local pharmacy) is medication to be sent to? [id number] - [place],  - [address] AT [place]  3. Do they need a 30 day or 90 day supply? 30 day supply

## 2021-04-14 DIAGNOSIS — Z1152 Encounter for screening for COVID-19: Secondary | ICD-10-CM | POA: Diagnosis not present

## 2021-05-19 DIAGNOSIS — L820 Inflamed seborrheic keratosis: Secondary | ICD-10-CM | POA: Diagnosis not present

## 2021-05-19 DIAGNOSIS — D225 Melanocytic nevi of trunk: Secondary | ICD-10-CM | POA: Diagnosis not present

## 2021-08-30 ENCOUNTER — Other Ambulatory Visit: Payer: Self-pay | Admitting: Cardiology

## 2021-11-21 ENCOUNTER — Other Ambulatory Visit: Payer: Self-pay | Admitting: Obstetrics and Gynecology

## 2021-11-21 DIAGNOSIS — Z1231 Encounter for screening mammogram for malignant neoplasm of breast: Secondary | ICD-10-CM

## 2021-12-12 ENCOUNTER — Ambulatory Visit
Admission: RE | Admit: 2021-12-12 | Discharge: 2021-12-12 | Disposition: A | Discharge status: Home / Self Care | Payer: BC Managed Care – PPO | Source: Ambulatory Visit | Attending: Obstetrics and Gynecology | Admitting: Obstetrics and Gynecology

## 2021-12-12 DIAGNOSIS — Z1231 Encounter for screening mammogram for malignant neoplasm of breast: Secondary | ICD-10-CM | POA: Diagnosis not present

## 2021-12-26 ENCOUNTER — Encounter: Payer: Self-pay | Admitting: Cardiology

## 2021-12-26 ENCOUNTER — Other Ambulatory Visit: Payer: Self-pay

## 2021-12-26 ENCOUNTER — Ambulatory Visit: Payer: BC Managed Care – PPO | Admitting: Cardiology

## 2021-12-26 VITALS — BP 122/78 | HR 86 | Resp 18 | Ht 65.0 in | Wt 143.8 lb

## 2021-12-26 DIAGNOSIS — I214 Non-ST elevation (NSTEMI) myocardial infarction: Secondary | ICD-10-CM

## 2021-12-26 DIAGNOSIS — F410 Panic disorder [episodic paroxysmal anxiety] without agoraphobia: Secondary | ICD-10-CM

## 2021-12-26 DIAGNOSIS — I2510 Atherosclerotic heart disease of native coronary artery without angina pectoris: Secondary | ICD-10-CM | POA: Diagnosis not present

## 2021-12-26 DIAGNOSIS — R030 Elevated blood-pressure reading, without diagnosis of hypertension: Secondary | ICD-10-CM

## 2021-12-26 DIAGNOSIS — Z9861 Coronary angioplasty status: Secondary | ICD-10-CM | POA: Diagnosis not present

## 2021-12-26 DIAGNOSIS — E785 Hyperlipidemia, unspecified: Secondary | ICD-10-CM

## 2021-12-26 DIAGNOSIS — Z7902 Long term (current) use of antithrombotics/antiplatelets: Secondary | ICD-10-CM

## 2021-12-26 DIAGNOSIS — R55 Syncope and collapse: Secondary | ICD-10-CM

## 2021-12-26 LAB — COMPREHENSIVE METABOLIC PANEL
ALT: 13 IU/L (ref 0–32)
AST: 24 IU/L (ref 0–40)
Albumin/Globulin Ratio: 2.1 (ref 1.2–2.2)
Albumin: 4.9 g/dL (ref 3.8–4.9)
Alkaline Phosphatase: 93 IU/L (ref 44–121)
BUN/Creatinine Ratio: 14 (ref 9–23)
BUN: 13 mg/dL (ref 6–24)
Bilirubin Total: 0.6 mg/dL (ref 0.0–1.2)
CO2: 23 mmol/L (ref 20–29)
Calcium: 10.2 mg/dL (ref 8.7–10.2)
Chloride: 103 mmol/L (ref 96–106)
Creatinine, Ser: 0.91 mg/dL (ref 0.57–1.00)
Globulin, Total: 2.3 g/dL (ref 1.5–4.5)
Glucose: 81 mg/dL (ref 70–99)
Potassium: 5 mmol/L (ref 3.5–5.2)
Sodium: 140 mmol/L (ref 134–144)
Total Protein: 7.2 g/dL (ref 6.0–8.5)
eGFR: 74 mL/min/{1.73_m2} (ref >59)

## 2021-12-26 LAB — LIPID PANEL
Chol/HDL Ratio: 3.7 ratio (ref 0.0–4.4)
Cholesterol, Total: 236 mg/dL — ABNORMAL HIGH (ref 100–199)
HDL: 63 mg/dL (ref >39)
LDL Chol Calc (NIH): 153 mg/dL — ABNORMAL HIGH (ref 0–99)
Triglycerides: 111 mg/dL (ref 0–149)
VLDL Cholesterol Cal: 20 mg/dL (ref 5–40)

## 2021-12-26 MED ORDER — PROPRANOLOL HCL 10 MG PO TABS: 10.0000 mg | ORAL_TABLET | Freq: Two times a day (BID) | ORAL | 11 refills | Status: DC | PRN

## 2021-12-26 NOTE — Assessment & Plan Note (Signed)
Subsequently resolved.  No further episodes.

## 2021-12-26 NOTE — Assessment & Plan Note (Signed)
We have tried everything from statins of all kinds, Zetia, bempedoic acid and 80 PCSK9 meters.  She has just been completely symptomatic with every single one of his medications.  Citing muscle weakness and fatigue.  She does want to get the stress issues and palpitations controlled before we discussed trying to treat her lipids, but I do think she may benefit from the [place].  We will check lipids and depending on results potentially discuss with our clinical pharmacy team in the lipid clinic about potentially pursuing an [place].

## 2021-12-26 NOTE — Assessment & Plan Note (Signed)
Alternating every other day aspirin and Effient.  No bleeding issues since making this adjustment.  This provides her a sense of security and protection.  As such she is not willing to change.  She did note more prominent bleeding when taking daily Effient.

## 2021-12-26 NOTE — Assessment & Plan Note (Signed)
I think her situational hypertension palpitations are probably related to pain attacks and not necessarily true hypertension.  I do not personally prescribe Xanax, but it has worked for her in the past -she has not been intolerant of SSRIs.  She is willing to try PRN propranolol.

## 2021-12-26 NOTE — Assessment & Plan Note (Signed)
Almost 9 years out from two-vessel PCI in setting of MI.  Has had repeat catheterization and Myoview that were relatively normal since then.  No longer having any anginal symptoms.  She continues to be very active with her competitive running.  Unfortunately, she has not really been intolerant of despite any medications. Pain that she is tolerating is alternating dosing of aspirin Monday and Effient the next day.  She feels very strongly about least having the Effient every other day.  We talked potentially coming off one of the other and she wants to at least be on this.  Not on beta-blocker because of fatigue.  However I will use PRN propranolol for situational hypertension and palpitations.  Blood pressures been relatively controlled, therefore we have not used ACE inhibitor or ARB.  Initially we tried and she got hypotensive.  For reasons already discussed, not on lipid agent.  Now that we have inclisiran is a potential option this is our last potential treatment.  She is not yet ready to consider that.  We will recheck her lipids, then discuss with [facility] Team.

## 2021-12-26 NOTE — Patient Instructions (Addendum)
Medication Instructions:   PROPRANOLOL 10 MG    YOU CAN TAKE 1 TO 2 TABLETS A DAY  FOR  ELEVATING BLOOD PRESSURE OR PALPIPATIONS    *If you need a refill on your cardiac medications before your next appointment, please call your pharmacy*   Lab Work: CMP LIPID  If you have labs (blood work) drawn today and your tests are completely normal, you will receive your results only by: MyChart Message (if you have MyChart) OR A paper copy in the mail If you have any lab test that is abnormal or we need to change your treatment, we will call you to review the results.   Testing/Procedures:  NOT NEEDED  Follow-Up: At [facility], you and your health needs are our priority.  As part of our continuing mission to provide you with exceptional heart care, we have created designated Provider Care Teams.  These Care Teams include your primary Cardiologist (physician) and Advanced Practice Providers (APPs -  Physician Assistants and Nurse Practitioners) who all work together to provide you with the care you need, when you need it.     Your next appointment:   12 month(s)  The format for your next appointment:   Virtual Visit   Provider:   Glenetta Hew, MD

## 2021-12-26 NOTE — Assessment & Plan Note (Signed)
Spikes in blood pressure did seem to be related to stress and essentially panic attacks.  These been doing pretty well but because of social issues and she is having some more of them now.  I have asked that she talk to her PCP about potentially refilling Xanax.  Because she is having palpitations probably associated with anxiety, treatment PRN propranolol 10 mg 1 to 2 tablets daily as needed.

## 2021-12-26 NOTE — Assessment & Plan Note (Signed)
Almost 9 years out from her MI with two-vessel PCI.  The initial several months to year after her MI were very stressful with PTSD type panic attacks.  She has now stabilized and has not had any further symptoms.  Doing very well.  Stress test done 2 years after her MI was low risk with small fixed perfusion defect from the diagonal branch MI.  Preserved EF.  No further anginal symptoms.

## 2021-12-26 NOTE — Progress Notes (Signed)
Primary Care Provider: Eulas Post, MD Cardiologist: Glenetta Hew, MD Electrophysiologist: None  Clinic Note: Chief Complaint  Patient presents with   Follow-up   Coronary Artery Disease    Doing well.  No angina   Hyperlipidemia    Has been intolerant of despite having medicine   Palpitations    Lots of stress - more frequent palpitations& spikes in bp    ===================================  ASSESSMENT/PLAN   Problem List Items Addressed This Visit       Cardiology Problems   CAD (coronary artery disease), native coronary artery - NSTEMI - S/P PCI x 2 of Circumflex and Diag 2 w/ DES 04/12/13 - Primary (Chronic)    Almost 9 years out from two-vessel PCI in setting of MI.  Has had repeat catheterization and Myoview that were relatively normal since then.  No longer having any anginal symptoms.  She continues to be very active with her competitive running.  Unfortunately, she has not really been intolerant of despite any medications. Pain that she is tolerating is alternating dosing of aspirin Monday and Effient the next day.  She feels very strongly about least having the Effient every other day.  We talked potentially coming off one of the other and she wants to at least be on this.  Not on beta-blocker because of fatigue.  However I will use PRN propranolol for situational hypertension and palpitations.  Blood pressures been relatively controlled, therefore we have not used ACE inhibitor or ARB.  Initially we tried and she got hypotensive.  For reasons already discussed, not on lipid agent.  Now that we have inclisiran is a potential option this is our last potential treatment.  She is not yet ready to consider that.  We will recheck her lipids, then discuss with [facility] Lipid Team.      Relevant Medications   propranolol (INDERAL) 10 MG tablet   Other Relevant Orders   EKG 12-Lead (Completed)   Lipid panel (Completed)   Comprehensive metabolic panel (Completed)    Non-ST elevated myocardial infarction (non-STEMI) (HCC) (Chronic)    Almost 9 years out from her MI with two-vessel PCI.  The initial several months to year after her MI were very stressful with PTSD type panic attacks.  She has now stabilized and has not had any further symptoms.  Doing very well.  Stress test done 2 years after her MI was low risk with small fixed perfusion defect from the diagonal branch MI.  Preserved EF.  No further anginal symptoms.      Relevant Medications   propranolol (INDERAL) 10 MG tablet   Other Relevant Orders   EKG 12-Lead (Completed)   Lipid panel (Completed)   Comprehensive metabolic panel (Completed)   Near syncope -- with orthostatic hypotension (Chronic)    Subsequently resolved.  No further episodes.      Relevant Medications   propranolol (INDERAL) 10 MG tablet   Situational hypertension (Chronic)    Spikes in blood pressure did seem to be related to stress and essentially panic attacks.  These been doing pretty well but because of social issues and she is having some more of them now.  I have asked that she talk to her PCP about potentially refilling Xanax.  Because she is having palpitations probably associated with anxiety, treatment PRN propranolol 10 mg 1 to 2 tablets daily as needed.      Relevant Medications   propranolol (INDERAL) 10 MG tablet   Other Relevant Orders  EKG 12-Lead (Completed)   Lipid panel (Completed)   Comprehensive metabolic panel (Completed)   Dyslipidemia, goal LDL below 70 (Chronic)    We have tried everything from statins of all kinds, Zetia, bempedoic acid and 80 PCSK9 meters.  She has just been completely symptomatic with every single one of his medications.  Citing muscle weakness and fatigue.  She does want to get the stress issues and palpitations controlled before we discussed trying to treat her lipids, but I do think she may benefit from the [place].  We will check lipids and depending on results  potentially discuss with our clinical pharmacy team in the lipid clinic about potentially pursuing an [place].      Relevant Medications   propranolol (INDERAL) 10 MG tablet     Other   Long term current use of antithrombotics/antiplatelets (Chronic)    Alternating every other day aspirin and Effient.  No bleeding issues since making this adjustment.  This provides her a sense of security and protection.  As such she is not willing to change.  She did note more prominent bleeding when taking daily Effient.      Panic attacks (Chronic)    I think her situational hypertension palpitations are probably related to pain attacks and not necessarily true hypertension.  I do not personally prescribe Xanax, but it has worked for her in the past -she has not been intolerant of SSRIs.  She is willing to try PRN propranolol.      Relevant Orders   EKG 12-Lead (Completed)   Lipid panel (Completed)   Comprehensive metabolic panel (Completed)   ===================================  HPI:    Robin Tapia is a 56 y.o. female with a Cardiovascular History Noted below who presents today for 6-month follow-up.  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  After one of her raises back in June 2014, she suffered a non-ST elevation MI and was found to have severe two-vessel disease -> PCI of LCx and 2nd Diag. -->  Following discharge she had several significant episodes of PTSD/panic attacks with near syncope.  These is subsequently resolved, but she still has pretty significant PTSD and panic attacks. CAD: NSTEMI 04/2013 - 2V CAD - DES PCI - 2 DES-LCx, 1 DES 2nd Diag -->  on maintenance ASA 81 mg & 10 mg Prasugrel (every other day dosing), No BP or Lipid meds Hyperlipidemia - STATIN, ZETIA & even PCSK9-I INTOLERANT  will not take Lipid medications (says that they keep her from being able to run) PTSD/ Anxiety d/o - has PRN  Xanax (did not tolerate SSRI).   Weston Fulco was last seen in August 2021 also an 57-month follow-up.  She was doing fine.  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  PTSD seem to be better controlled.  Less frequent use of Xanax.  She had tried the PCSK9 inhibitor and indicated that it kept her from "lifting her legs up" -> unable to lift her legs up onto the curb when running.  Recent Hospitalizations: None  Reviewed  CV studies:    The following studies were reviewed today: (if available, images/films reviewed: From Epic Chart or Care Everywhere) None:   Interval History:   Kinya Meine returns here today indicating that she has been having a lot of stress of late and as such her pressures have been going up higher with spikes into the 160s  and 170s every now and then.  She also notes that she is having more frequent palpitations.  Most the stress test with issues with her 24 year old son who has been dealing with try to go to drug rehab, but has had setbacks with the rehab being kicked out of the facility because of smoking a cigarette which apparently is now illegal.  She now is dealing with having her 68 year old son no longer in college but actually unemployed and essentially without any plans.  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  Unfortunately it is a lot of work and stress of a different kind.  From a cardiac standpoint other than the increase in blood pressure and palpitations, she is actually doing fine even with the spell she is denies any chest pain or pressure.  She is still training, but not bracing as much.  CV Review of Symptoms (Summary) Cardiovascular ROS: no chest pain or dyspnea on exertion positive for - irregular heartbeat, palpitations, rapid heart rate, and spikes in blood pressure negative  for - edema, orthopnea, paroxysmal nocturnal dyspnea, shortness of breath, or syncope/near syncope or TIA/amaurosis fugax, claudication  REVIEWED OF SYSTEMS   Review of Systems  Constitutional:  Negative for malaise/fatigue and weight loss (She actually has gained weight.  Understands she needs to adjust her diet.).  HENT:  Negative for congestion.   Respiratory:  Negative for shortness of breath.   Cardiovascular:        Per HPI  Gastrointestinal:  Negative for blood in stool and melena.  Genitourinary:  Negative for hematuria.  Musculoskeletal:  Positive for joint pain.  Neurological:  Negative for dizziness.  Psychiatric/Behavioral:  Negative for depression. The patient is nervous/anxious.    I have reviewed and (if needed) personally updated the patient's problem list, medications, allergies, past medical and surgical history, social and family history.   PAST MEDICAL HISTORY   Past Medical History:  Diagnosis Date   CAD S/P percutaneous coronary angioplasty 04/2013   - 100% D2, ~90% Cx-OM1; EF 45-50%   Dyslipidemia, goal LDL below 70    Reduced to 5 mg Crestor   Fainting spell    History of pneumonia ~2002   "walking PNA   NSTEMI - S/P PCI and stenting x 2 of Circumflex and Diagonal Branch w/ DES 04/12/13 04/12/2013   Pathophysiology consistent with STEMI with occluded diagonal branch he   Piriformis muscle pain    LLE   Presence of drug coated stent in a Diagonal branch of the LAD coronary artery 04/12/2013   Xence EXpd DES 2.25 mm x 15 mm - post-dil to 2.4 mm   Presence of drug coated stent in left circumflex coronary artery 04/12/2013   Xience EXpd DES 3.0 mm x 23 mm - post-dil to 3.25 mm    PAST SURGICAL HISTORY   Past Surgical History:  Procedure Laterality Date   CORONARY ANGIOPLASTY WITH STENT PLACEMENT  03/12/13   Xience Exp. 3.83mm x 23 mm (post-dil to 3.65mm) - OM1; Xience Exp. 2.25 mm x 15 mm - Diag 2 (post-dil 2.4 mm)   FINGER FRACTURE SURGERY Left ~2008   "ring  finger" (04/11/2013)   LEFT HEART CATHETERIZATION WITH CORONARY ANGIOGRAM N/A 04/12/2013   Procedure: LEFT HEART CATHETERIZATION WITH CORONARY ANGIOGRAM;  Surgeon: Lorretta Harp, MD;  Location: [facility] CATH LAB;  Service: Cardiovascular;  Laterality: N/A;   NM MYOVIEW LTD  05/30/2013  Low Risk nuclear stress test with small area of mild perfusion defect in the anterolateral wall. Normal function. -- Consistent with diagonal branch disease    Immunization History  Administered Date(s) Administered   Influenza,inj,Quad PF,6+ Mos 07/27/2013   Td 11/09/2002   Tdap 09/29/2014    MEDICATIONS/ALLERGIES   Current Meds  Medication Sig   ALPRAZolam (XANAX) 0.25 MG tablet TAKE 1 TABLET 3 TIMES A DAY AS NEEDED FOR ANXIETY   aspirin EC 81 MG tablet Take 1 tablet (81 mg total) by mouth every other day.   prasugrel (EFFIENT) 5 MG TABS tablet TAKE 1 TABLET EVERY DAY   propranolol (INDERAL) 10 MG tablet Take 1 tablet (10 mg total) by mouth 2 (two) times daily as needed. For elevating blood pressure, and palpitation   Current Facility-Administered Medications for the 12/26/21 encounter (Office Visit) with Leonie Man, MD  Medication   0.9 %  sodium chloride infusion    Allergies  Allergen Reactions   Crestor [Rosuvastatin]     Muscle aches at 10 mg daily, 5 mg daily, and 5 mg twice weekly   Lipitor [Atorvastatin]     Muscle aches     SOCIAL HISTORY/FAMILY HISTORY   Reviewed in Epic:  Pertinent findings:  Social History   Tobacco Use   Smoking status: Never   Smokeless tobacco: Never  Vaping Use   Vaping Use: Never used  Substance Use Topics   Alcohol use: No   Drug use: No   Social History   Social History Narrative   Socially she is in her second marriage. She has 4 children ages 12 and 59 from her first marriage ages 45 and 81 for her second marriage. Has no tobacco history but she admits to a lifelong commitment to exercise and running competitively. She is eagerly and has been  getting back into full training, and competitions.    OBJCTIVE -PE, EKG, labs   Wt Readings from Last 3 Encounters:  12/26/21 143 lb 12.8 oz (65.2 kg)  06/24/20 139 lb (63 kg)  12/29/18 146 lb 6.4 oz (66.4 kg)    Physical Exam: BP 122/78    Pulse 86    Resp 18    Ht 5\' 5"  (1.651 m)    Wt 143 lb 12.8 oz (65.2 kg)    LMP 01/24/2018    BMI 23.93 kg/m  Physical Exam Vitals reviewed.  Constitutional:      General: She is not in acute distress.    Appearance: Normal appearance. She is normal weight. She is not ill-appearing or toxic-appearing.  HENT:     Head: Normocephalic and atraumatic.  Neck:     Vascular: No carotid bruit or JVD.  Cardiovascular:     Rate and Rhythm: Normal rate and regular rhythm. No extrasystoles are present.    Chest Wall: PMI is not displaced.     Pulses: Normal pulses.     Heart sounds: Normal heart sounds and S1 normal. Heart sounds not distant. No murmur heard.   No friction rub. No gallop.  Musculoskeletal:     Cervical back: Normal range of motion and neck supple.  Neurological:     Mental Status: She is alert.    Adult ECG Report  Rate: 86 ;  Rhythm: normal sinus rhythm and Normal axis, intervals and durations. ;   Narrative Interpretation: Stable EKG.  Recent Labs:  needa new labs  Lab Results  Component Value Date   CHOL 222 (H) 12/29/2018   HDL 65  12/29/2018   LDLCALC 138 (H) 12/29/2018   TRIG 93 12/29/2018   CHOLHDL 3.4 12/29/2018   Lab Results  Component Value Date   CREATININE 0.69 12/29/2018   BUN 11 12/29/2018   NA 140 12/29/2018   K 5.1 12/29/2018   CL 104 12/29/2018   CO2 22 12/29/2018   No results found for: HGBA1C   ==================================================  COVID-19 Education: The signs and symptoms of COVID-19 were discussed with the patient and how to seek care for testing (follow up with PCP or arrange E-visit).    I spent a total of 40 minutes with the patient spent in direct patient consultation.   --> We talked a lot about Jakelyn's social situation with her son.  The discussion was essentially therapeutic allowing after to voice her concerns restrictions and acknowledges that this is the source of her palpitations. Additional time spent with chart review  / charting (studies, outside notes, etc): 15 min Total Time: 55 min  Current medicines are reviewed at length with the patient today.  (+/- concerns) none  This visit occurred during the SARS-CoV-2 public health emergency.  Safety protocols were in place, including screening questions prior to the visit, additional usage of staff PPE, and extensive cleaning of exam room while observing appropriate contact time as indicated for disinfecting solutions.  Notice: This dictation was prepared with Dragon dictation along with smart phrase technology. Any transcriptional errors that result from this process are unintentional and may not be corrected upon review.  Studies Ordered:   Orders Placed This Encounter  Procedures   Lipid panel   Comprehensive metabolic panel   EKG [id number]    Patient Instructions / Medication Changes & Studies & Tests Ordered   Patient Instructions  Medication Instructions:   PROPRANOLOL 10 MG    YOU CAN TAKE 1 TO 2 TABLETS A DAY  FOR  ELEVATING BLOOD PRESSURE OR PALPIPATIONS    *If you need a refill on your cardiac medications before your next appointment, please call your pharmacy*   Lab Work: CMP LIPID  If you have labs (blood work) drawn today and your tests are completely normal, you will receive your results only by: MyChart Message (if you have MyChart) OR A paper copy in the mail If you have any lab test that is abnormal or we need to change your treatment, we will call you to review the results.   Testing/Procedures:  NOT NEEDED  Follow-Up: At [facility], you and your health needs are our priority.  As part of our continuing mission to provide you with exceptional heart care, we  have created designated Provider Care Teams.  These Care Teams include your primary Cardiologist (physician) and Advanced Practice Providers (APPs -  Physician Assistants and Nurse Practitioners) who all work together to provide you with the care you need, when you need it.     Your next appointment:   12 month(s)  The format for your next appointment:   Virtual Visit   Provider:   Glenetta Hew, MD     Glenetta Hew, M.D., M.S. Interventional Cardiologist   Pager # [phone] Phone # [phone] [address] [place], [place] [address]   Thank you for choosing Heartcare at [facility]!!

## 2022-02-27 ENCOUNTER — Telehealth: Payer: Self-pay | Admitting: Cardiology

## 2022-02-27 NOTE — Telephone Encounter (Signed)
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 Pt states that she cn come pick the letter up. Please advise ?

## 2022-02-27 NOTE — Telephone Encounter (Signed)
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 ?

## 2022-03-09 NOTE — Telephone Encounter (Signed)
I think she probably would need set up a stress test for DOT physical. ? ?Need to find out what she needs so we can do this.  We will probably do it on Tuesday next week. ? ?Glenetta Hew, MD ? ?

## 2022-03-18 NOTE — Telephone Encounter (Signed)
Pt called back for DOT f/u.  ?

## 2022-03-19 ENCOUNTER — Encounter: Payer: Self-pay | Admitting: Cardiology

## 2022-03-19 NOTE — Telephone Encounter (Signed)
Spoke to patient. She states she just need a note so she can drive.  Patient states the personnel informed her that a not e will suffice. ?Patient states she is not having any issues and she will not be paying for a test she does not think she needs because insurance will not cover it.   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 ?

## 2022-03-19 NOTE — Telephone Encounter (Signed)
Spoken to  patient  see telephone note ?

## 2022-03-23 ENCOUNTER — Ambulatory Visit: Payer: BC Managed Care – PPO | Admitting: Cardiology

## 2022-03-23 NOTE — Telephone Encounter (Signed)
Patient is calling back requesting a callback with the status of her DOT letter.  ?

## 2022-03-24 NOTE — Telephone Encounter (Signed)
Spoke to Dr Ellyn Hack - okay for letter. ?Called left message for patient .  Letter is ready for pick up .   ?

## 2022-04-14 DIAGNOSIS — Z124 Encounter for screening for malignant neoplasm of cervix: Secondary | ICD-10-CM | POA: Diagnosis not present

## 2022-04-14 DIAGNOSIS — Z6825 Body mass index (BMI) 25.0-25.9, adult: Secondary | ICD-10-CM | POA: Diagnosis not present

## 2022-04-14 DIAGNOSIS — Z01419 Encounter for gynecological examination (general) (routine) without abnormal findings: Secondary | ICD-10-CM | POA: Diagnosis not present

## 2022-04-14 DIAGNOSIS — Z1151 Encounter for screening for human papillomavirus (HPV): Secondary | ICD-10-CM | POA: Diagnosis not present

## 2022-06-19 DIAGNOSIS — D2261 Melanocytic nevi of right upper limb, including shoulder: Secondary | ICD-10-CM | POA: Diagnosis not present

## 2022-06-19 DIAGNOSIS — D2262 Melanocytic nevi of left upper limb, including shoulder: Secondary | ICD-10-CM | POA: Diagnosis not present

## 2022-06-19 DIAGNOSIS — L821 Other seborrheic keratosis: Secondary | ICD-10-CM | POA: Diagnosis not present

## 2022-06-19 DIAGNOSIS — L700 Acne vulgaris: Secondary | ICD-10-CM | POA: Diagnosis not present

## 2022-09-11 ENCOUNTER — Other Ambulatory Visit: Payer: Self-pay | Admitting: Cardiology

## 2022-11-09 HISTORY — PX: ORIF RADIAL HEAD / NECK FRACTURE: SUR956

## 2022-11-25 ENCOUNTER — Other Ambulatory Visit: Payer: Self-pay | Admitting: Obstetrics and Gynecology

## 2022-11-25 DIAGNOSIS — Z1231 Encounter for screening mammogram for malignant neoplasm of breast: Secondary | ICD-10-CM

## 2022-12-18 ENCOUNTER — Other Ambulatory Visit: Payer: Self-pay

## 2022-12-18 ENCOUNTER — Emergency Department (HOSPITAL_BASED_OUTPATIENT_CLINIC_OR_DEPARTMENT_OTHER)
Admission: EM | Admit: 2022-12-18 | Discharge: 2022-12-18 | Disposition: A | Discharge status: Home / Self Care | Payer: BC Managed Care – PPO | Attending: Emergency Medicine | Admitting: Emergency Medicine

## 2022-12-18 ENCOUNTER — Emergency Department (HOSPITAL_BASED_OUTPATIENT_CLINIC_OR_DEPARTMENT_OTHER): Payer: BC Managed Care – PPO | Admitting: Radiology

## 2022-12-18 ENCOUNTER — Emergency Department (HOSPITAL_BASED_OUTPATIENT_CLINIC_OR_DEPARTMENT_OTHER): Payer: BC Managed Care – PPO

## 2022-12-18 ENCOUNTER — Encounter (HOSPITAL_BASED_OUTPATIENT_CLINIC_OR_DEPARTMENT_OTHER): Payer: Self-pay | Admitting: Emergency Medicine

## 2022-12-18 DIAGNOSIS — S52122A Displaced fracture of head of left radius, initial encounter for closed fracture: Secondary | ICD-10-CM | POA: Diagnosis not present

## 2022-12-18 DIAGNOSIS — Z79899 Other long term (current) drug therapy: Secondary | ICD-10-CM | POA: Insufficient documentation

## 2022-12-18 DIAGNOSIS — W1830XA Fall on same level, unspecified, initial encounter: Secondary | ICD-10-CM | POA: Insufficient documentation

## 2022-12-18 DIAGNOSIS — I10 Essential (primary) hypertension: Secondary | ICD-10-CM | POA: Insufficient documentation

## 2022-12-18 DIAGNOSIS — S80211A Abrasion, right knee, initial encounter: Secondary | ICD-10-CM | POA: Insufficient documentation

## 2022-12-18 DIAGNOSIS — S42452A Displaced fracture of lateral condyle of left humerus, initial encounter for closed fracture: Secondary | ICD-10-CM | POA: Diagnosis not present

## 2022-12-18 DIAGNOSIS — S59912A Unspecified injury of left forearm, initial encounter: Secondary | ICD-10-CM | POA: Diagnosis not present

## 2022-12-18 DIAGNOSIS — S42202A Unspecified fracture of upper end of left humerus, initial encounter for closed fracture: Secondary | ICD-10-CM | POA: Insufficient documentation

## 2022-12-18 DIAGNOSIS — Z23 Encounter for immunization: Secondary | ICD-10-CM | POA: Diagnosis not present

## 2022-12-18 DIAGNOSIS — S0081XA Abrasion of other part of head, initial encounter: Secondary | ICD-10-CM | POA: Diagnosis not present

## 2022-12-18 DIAGNOSIS — W19XXXA Unspecified fall, initial encounter: Secondary | ICD-10-CM

## 2022-12-18 DIAGNOSIS — Z043 Encounter for examination and observation following other accident: Secondary | ICD-10-CM | POA: Diagnosis not present

## 2022-12-18 DIAGNOSIS — Z7982 Long term (current) use of aspirin: Secondary | ICD-10-CM | POA: Insufficient documentation

## 2022-12-18 MED ORDER — TETANUS-DIPHTH-ACELL PERTUSSIS 5-2.5-18.5 LF-MCG/0.5 IM SUSY
0.5000 mL | PREFILLED_SYRINGE | Freq: Once | INTRAMUSCULAR | Status: AC
  Administered 2022-12-18: 0.5 mL via INTRAMUSCULAR
  Filled 2022-12-18: qty 0.5

## 2022-12-18 MED ORDER — HYDROCODONE-ACETAMINOPHEN 5-325 MG PO TABS: 1.0000 | ORAL_TABLET | Freq: Four times a day (QID) | ORAL | 0 refills | Status: DC | PRN

## 2022-12-18 MED ORDER — ACETAMINOPHEN 325 MG PO TABS
650.0000 mg | ORAL_TABLET | Freq: Once | ORAL | Status: AC
  Administered 2022-12-18: 650 mg via ORAL
  Filled 2022-12-18: qty 2

## 2022-12-18 NOTE — ED Notes (Signed)
ED Provider at bedside. 

## 2022-12-18 NOTE — ED Notes (Signed)
Discharge paperwork given and verbally understood. 

## 2022-12-18 NOTE — ED Provider Notes (Signed)
[facility] Provider Note   CSN: [id number] Arrival date & time: 12/18/22  [id number]     History  Chief Complaint  Patient presents with   Arm Injury    Robin Tapia is a 57 y.o. female.  With history of NSTEMI requiring stents on prasugrel who presents to the ED for evaluation of a fall.  She states that approximately 45 minutes prior to arrival she was running on a trail when she tripped and landed on her left side.  She noticed significant left elbow pain and inability to bend it immediately afterwards.  She did also hit her head.  She did not lose consciousness.  Does not complain of a headache, numbness, tingling, weakness, vision changes.  She has been on prasugrel for approximately 10 years.  She states her pain is currently a 2 out of 10 at rest but becomes severe with movement.  Denies forearm, wrist, hand, shoulder pain on the left side.  Also reports an abrasion to the right knee but denies pain to the area.  She was ambulatory immediately after the incident.  Her last tetanus was in 2015.   Arm Injury      Home Medications Prior to Admission medications   Medication Sig Start Date End Date Taking? Authorizing Provider  HYDROcodone-acetaminophen (NORCO/VICODIN) 5-325 MG tablet Take 1 tablet by mouth every 6 (six) hours as needed. 12/18/22  Yes Jarrell Armond, [place], PA-C  ALPRAZolam Duanne Moron) 0.25 MG tablet TAKE 1 TABLET 3 TIMES A DAY AS NEEDED FOR ANXIETY 02/28/21   Leonie Man, MD  aspirin EC 81 MG tablet Take 1 tablet (81 mg total) by mouth every other day. 12/29/18   Leonie Man, MD  prasugrel (EFFIENT) 5 MG TABS tablet TAKE 1 TABLET BY MOUTH EVERY DAY 09/11/22   Leonie Man, MD  propranolol (INDERAL) 10 MG tablet Take 1 tablet (10 mg total) by mouth 2 (two) times daily as needed. For elevating blood pressure, and palpitation 12/26/21   Leonie Man, MD      Allergies    Crestor [rosuvastatin] and Lipitor  [atorvastatin]    Review of Systems   Review of Systems  Musculoskeletal:  Positive for arthralgias.  All other systems reviewed and are negative.   Physical Exam Updated Vital Signs BP (!) 140/72 (BP Location: Right Arm)   Pulse 62   Temp 97.8 F (36.6 C)   Resp 18   Ht 5' 5"$  (1.651 m)   Wt 65.8 kg   LMP 01/24/2018   SpO2 100%   BMI 24.13 kg/m  Physical Exam Vitals and nursing note reviewed.  Constitutional:      General: She is not in acute distress.    Appearance: Normal appearance. She is well-developed.     Comments: Resting comfortably in bed  HENT:     Head: Normocephalic and atraumatic.     Comments: No Battle sign or raccoon eyes Eyes:     Conjunctiva/sclera: Conjunctivae normal.  Cardiovascular:     Rate and Rhythm: Normal rate and regular rhythm.     Heart sounds: No murmur heard. Pulmonary:     Effort: Pulmonary effort is normal. No respiratory distress.     Breath sounds: Normal breath sounds. No stridor. No wheezing, rhonchi or rales.  Abdominal:     Palpations: Abdomen is soft.     Tenderness: There is no abdominal tenderness.  Musculoskeletal:        General: Tenderness present.  Cervical back: Neck supple.     Comments: Left elbow tenderness without obvious deformity.  Range of motion limited secondary to pain.  Skin:    General: Skin is warm and dry.     Capillary Refill: Capillary refill takes less than 2 seconds.     Comments: Abrasions to left side of forehead with no active bleeding.  Abrasion to right knee with no active bleeding.  Neurological:     General: No focal deficit present.     Mental Status: She is alert and oriented to person, place, and time.     Comments: No facial asymmetry, slurred speech, unilateral or global weakness.  Normal heel-to-shin.  Unable to determine bilateral pronator drift or finger-nose due to left elbow pain  Psychiatric:        Mood and Affect: Mood normal.     ED Results / Procedures / Treatments    Labs (all labs ordered are listed, but only abnormal results are displayed) Labs Reviewed - No data to display  EKG None  Radiology DG Elbow Complete Left  Result Date: 12/18/2022 CLINICAL DATA:  Fall.  Limited range of motion EXAM: LEFT ELBOW - COMPLETE 3+ VIEW COMPARISON:  None Available. FINDINGS: There is mildly displaced and depressed fracture of the radial head. There is also irregularity of the capitellum suggesting displaced fracture. Elevation of the anterior and posterior fat pads suggesting moderate-sized joint effusion. IMPRESSION: 1. Mildly displaced and depressed fracture of the radial head. 2. Displaced fracture of the capitellum. 3. Moderate joint effusion. Electronically Signed   By: Keane Police D.O.   On: 12/18/2022 10:29   CT Head Wo Contrast  Result Date: 12/18/2022 CLINICAL DATA:  Fall while running on a trail this morning. EXAM: CT HEAD WITHOUT CONTRAST TECHNIQUE: Contiguous axial images were obtained from the base of the skull through the vertex without intravenous contrast. RADIATION DOSE REDUCTION: This exam was performed according to the departmental dose-optimization program which includes automated exposure control, adjustment of the mA and/or kV according to patient size and/or use of iterative reconstruction technique. COMPARISON:  None Available. FINDINGS: Brain: The brainstem, cerebellum, cerebral peduncles, thalami, basal ganglia, basilar cisterns, and ventricular system appear within normal limits. No intracranial hemorrhage, mass lesion, or acute CVA. Periventricular white matter and corona radiata hypodensities favor mild chronic ischemic microvascular white matter disease. Vascular: Unremarkable Skull: Unremarkable Sinuses/Orbits: Unremarkable Other: Suspected left forehead scalp hematoma, image 12 series 2. IMPRESSION: 1. No acute intracranial findings. 2. Suspected left forehead scalp hematoma. 3. 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 Electronically Signed   By: Van Clines M.D.   On: 12/18/2022 10:27    Procedures Procedures    Medications Ordered in ED Medications  acetaminophen (TYLENOL) tablet 650 mg (650 mg Oral Given 12/18/22 0949)  Tdap (BOOSTRIX) injection 0.5 mL (0.5 mLs Intramuscular Given 12/18/22 1149)    ED Course/ Medical Decision Making/ A&P                             Medical Decision Making Amount and/or Complexity of Data Reviewed Radiology: ordered.  Risk OTC drugs.  This patient presents to the ED for concern of fall, left elbow pain, this involves an extensive number of treatment options, and is a complaint that carries with it a high risk of complications and morbidity.  The differential diagnosis includes intracranial abnormality, fracture, dislocation, strain, sprain, contusion  Co  morbidities that complicate the patient evaluation  NSTEMI requiring stents on prasugrel  My initial workup includes imaging, pain control  Additional history obtained from: Nursing notes from this visit.  I ordered imaging studies including CT head, x-ray left elbow I independently visualized and interpreted imaging which showed CT head shows no acute emergent intracranial abnormalities.  X-ray left elbow shows mildly displaced left radial head and left capitulum fractures I agree with the radiologist interpretation  Afebrile, slightly hypertensive but otherwise hemodynamically stable.  57 year old female presenting to the ED for evaluation of a fall and left elbow pain.  On exam, she has significant tenderness to palpation of the left elbow.  Her neurovascular status including capillary refill, radial pulse, sensation, grip strength are all intact.  She does have an abrasion to the forehead with a hematoma but has an overall reassuring neurologic examination.  Her CT head shows no acute emergent intracranial abnormalities.  She has no  midline cervical spine tenderness.  She reported her pain to be minimal at rest but worsens with movement.  She will be treated with posterior long-arm splint and sling and encouraged to follow-up with orthopedics.  She was given contact information and encouraged to call today for an ED follow-up visit.  She will be sent prescription for Norco for pain and given information regarding cautious use of this.  She was given return precautions.  Stable at discharge.  At this time there does not appear to be any evidence of an acute emergency medical condition and the patient appears stable for discharge with appropriate outpatient follow up. Diagnosis was discussed with patient who verbalizes understanding of care plan and is agreeable to discharge. I have discussed return precautions with patient and husband who verbalizes understanding. Patient encouraged to follow-up with their PCP within 1 week. All questions answered.  Patient's case discussed with Dr. Ronnald Nian who agrees with plan to discharge with follow-up.   Note: Portions of this report may have been transcribed using voice recognition software. Every effort was made to ensure accuracy; however, inadvertent computerized transcription errors may still be present.        Final Clinical Impression(s) / ED Diagnoses Final diagnoses:  Closed displaced fracture of head of left radius, initial encounter  Closed fracture of capitulum of left humerus, initial encounter  Fall, initial encounter    Rx / DC Orders ED Discharge Orders          Ordered    HYDROcodone-acetaminophen (NORCO/VICODIN) 5-325 MG tablet  Every 6 hours PRN        12/18/22 1111              Roylene Reason, PA-C 12/18/22 1151    Lennice Sites, DO 12/18/22 1441

## 2022-12-18 NOTE — ED Triage Notes (Signed)
Pt arrived POV, caox4, ambulatory. Pt reports mechanical fall while running on a trail this morning. Pt c/o pain in the L elbow. Swelling to the posterior elbow. Pt did hit her head, abrasions and hematoma to the L forehead. Pt denies LOC, dizziness, visual abnormalities but does take blood thinner Effient.

## 2022-12-18 NOTE — Discharge Instructions (Addendum)
You have been seen today for your complaint of fall, left elbow pain. Your imaging showed that you have fractured your left elbow. Your discharge medications include Norco. This is an opioid pain medication. You should only take this medication as needed for severe pain. You should not drive, operate heavy machinery or make important decisions while taking this medication. You should use alternative methods for pain relief while taking this medication including stretching, gentle range of motion, and alternating tylenol and ibuprofen. Home care instructions are as follows:  Ice the affected area for 15 minutes at a time multiple times throughout the day.  You may ice through your splint Follow up with: Dr. Lucia Gaskins.  He is an Doctor, general practice.  You should call today to schedule an appointment for an ED follow-up visit regarding your fractured left elbow Please seek immediate medical care if you develop any of the following symptoms: You have severe pain, especially if the pain changes significantly or suddenly. You have fluid or a bad smell coming from your splint. Your hand or fingers get cold or turn pale or blue. You lose feeling in any part of your hand or arm. At this time there does not appear to be the presence of an emergent medical condition, however there is always the potential for conditions to change. Please read and follow the below instructions.  Do not take your medicine if  develop an itchy rash, swelling in your mouth or lips, or difficulty breathing; call 911 and seek immediate emergency medical attention if this occurs.  You may review your lab tests and imaging results in their entirety on your MyChart account.  Please discuss all results of fully with your primary care provider and other specialist at your follow-up visit.  Note: Portions of this text may have been transcribed using voice recognition software. Every effort was made to ensure accuracy; however, inadvertent  computerized transcription errors may still be present.

## 2022-12-21 ENCOUNTER — Other Ambulatory Visit: Payer: BC Managed Care – PPO

## 2022-12-21 ENCOUNTER — Other Ambulatory Visit: Payer: Self-pay | Admitting: Orthopaedic Surgery

## 2022-12-21 DIAGNOSIS — S52125A Nondisplaced fracture of head of left radius, initial encounter for closed fracture: Secondary | ICD-10-CM

## 2022-12-21 DIAGNOSIS — M25522 Pain in left elbow: Secondary | ICD-10-CM | POA: Diagnosis not present

## 2022-12-22 ENCOUNTER — Other Ambulatory Visit: Payer: BC Managed Care – PPO

## 2022-12-22 ENCOUNTER — Ambulatory Visit
Admission: RE | Admit: 2022-12-22 | Discharge: 2022-12-22 | Disposition: A | Discharge status: Home / Self Care | Payer: BC Managed Care – PPO | Source: Ambulatory Visit | Attending: Orthopaedic Surgery | Admitting: Orthopaedic Surgery

## 2022-12-22 DIAGNOSIS — S52125A Nondisplaced fracture of head of left radius, initial encounter for closed fracture: Secondary | ICD-10-CM

## 2022-12-22 DIAGNOSIS — S42452A Displaced fracture of lateral condyle of left humerus, initial encounter for closed fracture: Secondary | ICD-10-CM | POA: Diagnosis not present

## 2022-12-22 DIAGNOSIS — S52122A Displaced fracture of head of left radius, initial encounter for closed fracture: Secondary | ICD-10-CM | POA: Diagnosis not present

## 2022-12-24 DIAGNOSIS — S42402A Unspecified fracture of lower end of left humerus, initial encounter for closed fracture: Secondary | ICD-10-CM | POA: Diagnosis not present

## 2022-12-25 ENCOUNTER — Telehealth: Payer: Self-pay | Admitting: *Deleted

## 2022-12-25 NOTE — Telephone Encounter (Signed)
I s/w the pt and she is agreeable to plan of care for in office appt 12/28/22 @ 8:50 with Coletta Memos, FNP. See notes from Doreene Adas, [facility] in regard to ASA and Effient. Pt thanked me for the help. I will update all parties involved.

## 2022-12-25 NOTE — Progress Notes (Unsigned)
Cardiology Clinic Note   Patient Name: Robin Tapia Date of Encounter: 12/28/2022  Primary Care Provider:  Eulas Post, MD Primary Cardiologist:  Glenetta Hew, MD  Patient Profile    Robin Tapia 57 year old female presents the clinic today for follow-up evaluation of her coronary artery disease and preoperative cardiac evaluation.  Past Medical History    Past Medical History:  Diagnosis Date   CAD S/P percutaneous coronary angioplasty 04/2013   - 100% D2, ~90% Cx-OM1; EF 45-50%   Dyslipidemia, goal LDL below 70    Reduced to 5 mg Crestor   Fainting spell    History of pneumonia ~2002   "walking PNA   NSTEMI - S/P PCI and stenting x 2 of Circumflex and Diagonal Branch w/ DES 04/12/13 04/12/2013   Pathophysiology consistent with STEMI with occluded diagonal branch he   Piriformis muscle pain    LLE   Presence of drug coated stent in a Diagonal branch of the LAD coronary artery 04/12/2013   Xence EXpd DES 2.25 mm x 15 mm - post-dil to 2.4 mm   Presence of drug coated stent in left circumflex coronary artery 04/12/2013   Xience EXpd DES 3.0 mm x 23 mm - post-dil to 3.25 mm   Past Surgical History:  Procedure Laterality Date   CORONARY ANGIOPLASTY WITH STENT PLACEMENT  03/12/13   Xience Exp. 3.1m x 23 mm (post-dil to 3.289m - OM1; Xience Exp. 2.25 mm x 15 mm - Diag 2 (post-dil 2.4 mm)   FINGER FRACTURE SURGERY Left ~2008   "ring finger" (04/11/2013)   LEFT HEART CATHETERIZATION WITH CORONARY ANGIOGRAM N/A 04/12/2013   Procedure: LEFT HEART CATHETERIZATION WITH CORONARY ANGIOGRAM;  Surgeon: JoLorretta HarpMD;  [facility];  Service: Cardiovascular;  Laterality: N/A;   NM MYOVIEW LTD  05/30/2013   Low Risk nuclear stress test with small area of mild perfusion defect in the anterolateral wall. Normal function. -- Consistent with diagonal branch disease    Allergies  Allergies  Allergen Reactions   Crestor [Rosuvastatin]     Muscle  aches at 10 mg daily, 5 mg daily, and 5 mg twice weekly   Lipitor [Atorvastatin]     Muscle aches     History of Present Illness    Robin Papaniaas a PMH of hyperlipidemia, situational hypertension, NSTEMI with cardiac catheterization (PCI x 2 to her circumflex and diagonal with DES 6/14), presyncope, posttraumatic stress reaction, panic attacks, and left foot pain.  She was seen in follow-up by Dr. HaEllyn Hackn 12/26/2021.  During that time she was having no anginal symptoms.  She continued to run competitively.  She is not a candidate for beta-blocker therapy due to fatigue.  She does use propranolol as needed for situational hypertension and palpitations.  She is not a candidate for ACE/ARB due to hypotension.  She repeated stress testing 2021 which showed low risk and small fixed perfusion defect from diagonal branch MI.  Her EF was noted to be preserved.  She denied syncopal events.  Previously intolerant of statin therapy, ezetimibe, bempedoic acid, and PCSK9 inhibitors.  Did not wish to trial inclisiran.  She was continued on every other day aspirin and Effient and denied bleeding issues.  It was felt that her situational hypertension was related to panic attacks and not true hypertension.  Propranolol as needed was felt to be a good choice for therapy for her situational hypertension related to panic attacks.  (Stress related to  family issues related to her son.  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  She denied chest pain and pressure.  She presented to the emergency department on 12/18/2022.  She had been Trail running.  She tripped and landed on her left side.  She noted significant left elbow pain and inability to bend her arm immediately afterwards.  She also hit her head.  She did not lose consciousness.  She denied neurological changes.  She reported that her pain was 2 out of 10.  Pain was more severe with movement.  She denied forearm wrist hand shoulder  pain.  She was noted to have a mildly displaced left radial head and left capitulum fractures.  She presents to the clinic today for follow-up evaluation and preoperative cardiac evaluation.  She states she is having some pain in her left elbow [place]  We reviewed her previous cardiac catheterization and stenting.  She discontinued Effient use after her fall.  She also discontinued aspirin use after her fall.  We reviewed restarting aspirin after surgery.  She expressed understanding.  She continues to have life related stressors.  Her blood pressure initially today is in the [id number] and on recheck is 138/80.  She notes increased anxiety with being in the cardiology office.  Her EKG shows normal sinus rhythm with sinus arrhythmia 74 bpm.  We did discuss starting inclisiran for lipid management.  She will contact the office after surgery to get more information and inquire about details.  Will plan follow-up in 1 year.  Today she denies chest pain, shortness of breath, lower extremity edema, fatigue, palpitations, melena, hematuria, hemoptysis, diaphoresis, weakness, presyncope, syncope, orthopnea, and PND.    Home Medications    Prior to Admission medications   Medication Sig Start Date End Date Taking? Authorizing Provider  ALPRAZolam (Duanne Moron 0.25 MG tablet TAKE 1 TABLET 3 TIMES A DAY AS NEEDED FOR ANXIETY 02/28/21   HLeonie Man MD  aspirin EC 81 MG tablet Take 1 tablet (81 mg total) by mouth every other day. 12/29/18   HLeonie Man MD  HYDROcodone-acetaminophen (NORCO/VICODIN) 5-325 MG tablet Take 1 tablet by mouth every 6 (six) hours as needed. 12/18/22   Schutt, AGrafton Folk PA-C  prasugrel (EFFIENT) 5 MG TABS tablet TAKE 1 TABLET BY MOUTH EVERY DAY 09/11/22   HLeonie Man MD  propranolol (INDERAL) 10 MG tablet Take 1 tablet (10 mg total) by mouth 2 (two) times daily as needed. For elevating blood pressure, and palpitation 12/26/21   HLeonie Man  MD    Family History    Family History  Problem Relation Age of Onset   Hypertension Father    Hyperlipidemia Father    Alcohol abuse Mother    Hyperlipidemia Mother    She indicated that her mother is alive. She indicated that her father is alive.  Social History    Social History   Socioeconomic History   Marital status: Married    Spouse name: Not on file   Number of children: Not on file   Years of education: Not on file   Highest education level: Not on file  Occupational History   Not on file  Tobacco Use   Smoking status: Never   Smokeless tobacco: Never  Vaping Use   Vaping Use: Never used  Substance and Sexual Activity   Alcohol use: No   Drug use: No   Sexual activity: Yes    Birth  control/protection: None  Other Topics Concern   Not on file  Social History Narrative   Socially she is in her second marriage. She has 4 children ages 23 and 39 from her first marriage ages 43 and 12 for her second marriage. Has no tobacco history but she admits to a lifelong commitment to exercise and running competitively. She is eagerly and has been getting back into full training, and competitions.   Social Determinants of Health   Financial Resource Strain: Not on file  Food Insecurity: Not on file  Transportation Needs: Not on file  Physical Activity: Not on file  Stress: Not on file  Social Connections: Not on file  Intimate Partner Violence: Not on file     Review of Systems    General:  No chills, fever, night sweats or weight changes.  Cardiovascular:  No chest pain, dyspnea on exertion, edema, orthopnea, palpitations, paroxysmal nocturnal dyspnea. Dermatological: No rash, lesions/masses Respiratory: No cough, dyspnea Urologic: No hematuria, dysuria Abdominal:   No nausea, vomiting, diarrhea, bright red blood per rectum, melena, or hematemesis Neurologic:  No visual changes, wkns, changes in mental status. All other systems reviewed and are otherwise  negative except as noted above.  Physical Exam    VS:  BP 138/80   Pulse 74   Ht 5' 5"$  (1.651 m)   Wt 145 lb 12.8 oz (66.1 kg)   LMP 01/24/2018   SpO2 98%   BMI 24.26 kg/m  , BMI Body mass index is 24.26 kg/m. GEN: Well nourished, well developed, in no acute distress. HEENT: normal. Neck: Supple, no JVD, carotid bruits, or masses. Cardiac: RRR, no murmurs, rubs, or gallops. No clubbing, cyanosis, edema.  Radials/DP/PT 2+ and equal bilaterally.  Respiratory:  Respirations regular and unlabored, clear to auscultation bilaterally. GI: Soft, nontender, nondistended, BS + x 4. MS: no deformity or atrophy. Skin: warm and dry, no rash. Neuro:  Strength and sensation are intact. Psych: Normal affect.  Accessory Clinical Findings    Recent Labs: No results found for requested labs within last 365 days.   Recent Lipid Panel    Component Value Date/Time   CHOL 236 (H) 12/26/2021 1053   CHOL 124 05/02/2013 0915   TRIG 111 12/26/2021 1053   TRIG 64 05/02/2013 0915   HDL 63 12/26/2021 1053   HDL 53 05/02/2013 0915   CHOLHDL 3.7 12/26/2021 1053   CHOLHDL 3.6 04/25/2015 0922   VLDL 20 04/25/2015 0922   LDLCALC 153 (H) 12/26/2021 1053   LDLCALC 58 05/02/2013 0915         ECG personally reviewed by me today-normal sinus rhythm with sinus arrhythmia moderate voltage criteria for LVH 74 bpm - No acute changes  Nuclear stress test 05/30/2013   Notes Recorded by Leonie Man, MD on 05/30/2013 at 4:17 PM Good news - the study looks pretty normal.  There is a hint of a small area that has decreased blood flow -- this correlates well with a small branch of one of your stented arteries that was too small to try to open up.  We actually saw faint flow in it once the stent was placed.  It will probably open back up on its on & should not cause any symptoms.     Otherwise, this looks great.Marland Kitchen   Leonie Man, MD  Procedure Date: 05/30/2013   Nuclear Med Background Indication for  Stress Test:  Stent Patency History:  MI 6/14, Stent 6/14 Cardiac Risk Factors: Lipids  Symptoms:  Chest Pain     Nuclear Pre-Procedure Caffeine/Decaff Intake:  7:00pm NPO After: 5:30am   IV Site: R Antecubital  IV 0.9% NS with Angio Cath:  22g  Chest Size (in):  n/a IV Started by: Azucena Cecil, RN  Height: 5' 5"$  (1.651 m)  Cup Size: C  BMI:  Body mass index is 22.13 kg/(m^2). Weight:  133 lb (60.328 kg)    Tech Comments:  n/a      Nuclear Med Study 1 or 2 day study: 1 day  Stress Test Type:  [place] Provider:  Glenetta Hew, MD    Resting Radionuclide: Technetium 17mSestamibi  Resting Radionuclide Dose: 10.1 mCi   Stress Radionuclide:  Technetium 91mestamibi  Stress Radionuclide Dose: 31.0 mCi            Stress Protocol Rest HR: 66 Stress HR:113  Rest BP: 132/80 Stress BP:157/79  Exercise Time (min): n/a METS: n/a      Dose of Adenosine (mg):  n/a Dose of Lexiscan: 0.4 mg  Dose of Atropine (mg): n/a Dose of Dobutamine: n/a mcg/kg/min (at max HR)  Stress Test Technologist: GwMellody MemosCCT Nuclear Technologist: RoOtho PerlCNMT    Rest Procedure:  Myocardial perfusion imaging was performed at rest 45 minutes following the intravenous administration of Technetium 9962mstamibi. Stress Procedure:  The patient received IV Lexiscan 0.4 mg over 15-seconds.  Technetium 48m33mtamibi injected at 30-seconds.  There were no significant changes with Lexiscan.  Quantitative spect images were obtained after a 45 minute delay.   Transient Ischemic Dilatation (Normal <1.22):  1.01 Lung/Heart Ratio (Normal <0.45):  0.27 QGS EDV:  96 ml QGS ESV:  37 ml LV Ejection Fraction: 61%   Signed by RobiOtho PerlMT   Rest ECG: NSR with non-specific ST-T wave changes   Stress ECG: No significant change from baseline ECG and No significant ST segment change suggestive of ischemia.   QPS Raw Data Images:  There was significnt sub-diaphragmatic tracer uptake more  notable in rest than stress images. Stress Images:  There is mild apical thinning with normal uptake in other regions. The is a possible small, mild intensity anterolateral perfusion defect with mild reversibility. Rest Images:  Normal homogeneous uptake in all areas of the myocardium. Subtraction (SDS):  There is no evidence of scar or significant ischemia.   Impression Exercise Capacity:  Lexiscan with no exercise. BP Response:  Normal blood pressure response. Clinical Symptoms:  There is dyspnea. ECG Impression:  No significant ECG changes with Lexiscan. Comparison with Prior Nuclear Study: No previous nuclear study performed   Overall Impression:  Low risk stress nuclear study with a possible mild anterolateral defect not noted by the computer processing.  .   LV Wall Motion:  NL LV Function; NL Wall Motion     HARDING,DAVID W, MD   05/30/2013 1:27 PM           Assessment & Plan   1.  Coronary artery disease-no chest pain today.  NSTEMI with cardiac catheterization and PCI x 2 to her circumflex and diagonal with DES 2014.  Previously did not tolerate beta-blocker therapy due to fatigue.  Did not tolerate ACE ARB due to hypotension.  Reports that she stopped taking Effient and aspirin in the fall.  I recommended that she resume her aspirin therapy after her open reduction internal fixation of her elbow. Continue aspirin postoperatively Heart healthy low-sodium diet-salty 6 given  Hyperlipidemia-LDL 153 on 12/26/2021.  Intolerant of statin therapy,  ezetimibe, bempedoic acid Continue aspirinn-restart postoperatively Heart healthy low-sodium high-fiber diet Repeat fasting lipids and LFTs-to be drawn at GYN office after surgery, requested that they be sent to our office Plans to contact office postoperatively to discuss inclisiran therapy.  Near syncope-denies recent episodes of lightheadedness, presyncope or syncope. Maintain p.o. hydration  Panic attacks, situational  hypotension-previously felt that her episodes were related to increased stress.  SSRI was recommended.  Previously tolerated Xanax well. Continue propranolol as needed  Preoperative cardiac evaluation-open reduction of left elbow fracture, Dr. Milly Jakob, [facility]    Primary Cardiologist: Glenetta Hew, MD  Chart reviewed as part of pre-operative protocol coverage. Given past medical history and time since last visit, based on ACC/AHA guidelines, Hawanya Hecksel would be at acceptable risk for the planned procedure without further cardiovascular testing.   Patient was advised that if she develops new symptoms prior to surgery to contact our office to arrange a follow-up appointment.  He verbalized understanding.  Her RCRI is a class II risk, 0.9% risk of major cardiac event.  She is able to complete greater than 4 METS of physical activity.  I will route this recommendation to the requesting party via Epic fax function and remove from pre-op pool.   Disposition: Follow-up with Dr. Ellyn Hack postoperatively.   Jossie Ng. Reesha Debes NP-C     12/28/2022, 9:21 AM [place] [address] Office [phone] Fax [phone]    I spent 14 minutes examining this patient, reviewing medications, and using patient centered shared decision making involving her cardiac care.  Prior to her visit I spent greater than 20 minutes reviewing her past medical history,  medications, and prior cardiac tests.

## 2022-12-25 NOTE — Telephone Encounter (Addendum)
   Name: Robin Tapia  DOB: 1966-02-18  MRN: [id number]  Primary Cardiologist: Glenetta Hew, MD  Chart reviewed as part of pre-operative protocol coverage. Because of Robin Tapia's past medical history and time since last visit, she will require a follow-up in-office visit in order to better assess preoperative cardiovascular risk.  Pre-op covering staff: - Please schedule appointment and call patient to inform them. If patient already had an upcoming appointment within acceptable timeframe, please add "pre-op clearance" to the appointment notes so provider is aware. - Please contact requesting surgeon's office via preferred method (i.e, phone, fax) to inform them of need for appointment prior to surgery.    Per Dr. Allison Quarry last note, the patient is also taking intermittent effient, alternating with ASA.   I called the patient, she is not taking effient or ASA since her fall.   Tami Lin Turhan Chill, PA  12/25/2022, 1:55 PM

## 2022-12-25 NOTE — Telephone Encounter (Signed)
   Pre-operative Risk Assessment    Patient Name: Robin Tapia  DOB: 07/20/1966 MRN: [id number]      Request for Surgical Clearance    Procedure:   Open Treatment of left elbow fracture.  Date of Surgery:  Clearance 12/30/22                                 Surgeon:  Dr. Milly Jakob Surgeon's Group or Practice Name:  [facility] Phone number:  [phone] Fax number:  [phone]   Type of Clearance Requested:   - Medical  - Pharmacy:  Hold Aspirin TBD   Type of Anesthesia:  MAC with Pre-Op Block   Additional requests/questions:    Signed, Greer Ee   12/25/2022, 11:45 AM \

## 2022-12-28 ENCOUNTER — Ambulatory Visit: Payer: BC Managed Care – PPO | Attending: General Practice | Admitting: General Practice

## 2022-12-28 ENCOUNTER — Encounter: Payer: Self-pay | Admitting: General Practice

## 2022-12-28 VITALS — BP 138/80 | HR 74 | Ht 65.0 in | Wt 145.8 lb

## 2022-12-28 DIAGNOSIS — Z01810 Encounter for preprocedural cardiovascular examination: Secondary | ICD-10-CM

## 2022-12-28 DIAGNOSIS — R55 Syncope and collapse: Secondary | ICD-10-CM

## 2022-12-28 DIAGNOSIS — Z9861 Coronary angioplasty status: Secondary | ICD-10-CM

## 2022-12-28 DIAGNOSIS — I2510 Atherosclerotic heart disease of native coronary artery without angina pectoris: Secondary | ICD-10-CM | POA: Diagnosis not present

## 2022-12-28 DIAGNOSIS — R030 Elevated blood-pressure reading, without diagnosis of hypertension: Secondary | ICD-10-CM | POA: Diagnosis not present

## 2022-12-28 DIAGNOSIS — E785 Hyperlipidemia, unspecified: Secondary | ICD-10-CM

## 2022-12-28 NOTE — Patient Instructions (Addendum)
Medication Instructions:  CONTINUE ASPIRIN AFTER SURGERY; STOP EFFIENT.  *If you need a refill on your cardiac medications before your next appointment, please call your pharmacy*  Lab Work: HAVE YOUR GYN SENT [place] YOUR LAB RESULTS TO [phone] If you have labs (blood work) drawn today and your tests are completely normal, you will receive your results only by: [place] (if you have MyChart) OR A paper copy in the mail If you have any lab test that is abnormal or we need to change your treatment, we will call you to review the results.  Testing/Procedures: NONE    Follow-Up: At [facility], you and your health needs are our priority.  As part of our continuing mission to provide you with exceptional heart care, we have created designated Provider Care Teams.  These Care Teams include your primary Cardiologist (physician) and Advanced Practice Providers (APPs -  Physician Assistants and Nurse Practitioners) who all work together to provide you with the care you need, when you need it.  We recommend signing up for the patient portal called "MyChart".  Sign up information is provided on this After Visit Summary.  MyChart is used to connect with patients for Virtual Visits (Telemedicine).  Patients are able to view lab/test results, encounter notes, upcoming appointments, etc.  Non-urgent messages can be sent to your provider as well.   To learn more about what you can do with MyChart, go to [email or web address].    Your next appointment:   12 month(s)  Provider:   Glenetta Hew, MD  or Coletta Memos, FNP        Other Instructions

## 2022-12-30 DIAGNOSIS — S52122A Displaced fracture of head of left radius, initial encounter for closed fracture: Secondary | ICD-10-CM | POA: Diagnosis not present

## 2022-12-30 DIAGNOSIS — X58XXXA Exposure to other specified factors, initial encounter: Secondary | ICD-10-CM | POA: Diagnosis not present

## 2022-12-30 DIAGNOSIS — W19XXXA Unspecified fall, initial encounter: Secondary | ICD-10-CM | POA: Diagnosis not present

## 2022-12-30 DIAGNOSIS — S42452A Displaced fracture of lateral condyle of left humerus, initial encounter for closed fracture: Secondary | ICD-10-CM | POA: Diagnosis not present

## 2022-12-30 DIAGNOSIS — G8918 Other acute postprocedural pain: Secondary | ICD-10-CM | POA: Diagnosis not present

## 2023-01-07 DIAGNOSIS — S42402A Unspecified fracture of lower end of left humerus, initial encounter for closed fracture: Secondary | ICD-10-CM | POA: Diagnosis not present

## 2023-01-07 DIAGNOSIS — M25622 Stiffness of left elbow, not elsewhere classified: Secondary | ICD-10-CM | POA: Diagnosis not present

## 2023-01-07 DIAGNOSIS — S52102D Unspecified fracture of upper end of left radius, subsequent encounter for closed fracture with routine healing: Secondary | ICD-10-CM | POA: Diagnosis not present

## 2023-01-14 ENCOUNTER — Ambulatory Visit
Admission: RE | Admit: 2023-01-14 | Discharge: 2023-01-14 | Disposition: A | Discharge status: Home / Self Care | Payer: BC Managed Care – PPO | Source: Ambulatory Visit | Attending: Obstetrics and Gynecology | Admitting: Obstetrics and Gynecology

## 2023-01-14 DIAGNOSIS — Z1231 Encounter for screening mammogram for malignant neoplasm of breast: Secondary | ICD-10-CM

## 2023-01-15 DIAGNOSIS — S52102D Unspecified fracture of upper end of left radius, subsequent encounter for closed fracture with routine healing: Secondary | ICD-10-CM | POA: Diagnosis not present

## 2023-01-15 DIAGNOSIS — S42492D Other displaced fracture of lower end of left humerus, subsequent encounter for fracture with routine healing: Secondary | ICD-10-CM | POA: Diagnosis not present

## 2023-01-15 DIAGNOSIS — M25622 Stiffness of left elbow, not elsewhere classified: Secondary | ICD-10-CM | POA: Diagnosis not present

## 2023-01-25 DIAGNOSIS — M25622 Stiffness of left elbow, not elsewhere classified: Secondary | ICD-10-CM | POA: Diagnosis not present

## 2023-01-25 DIAGNOSIS — S42492D Other displaced fracture of lower end of left humerus, subsequent encounter for fracture with routine healing: Secondary | ICD-10-CM | POA: Diagnosis not present

## 2023-01-25 DIAGNOSIS — S52102D Unspecified fracture of upper end of left radius, subsequent encounter for closed fracture with routine healing: Secondary | ICD-10-CM | POA: Diagnosis not present

## 2023-01-28 DIAGNOSIS — S42402A Unspecified fracture of lower end of left humerus, initial encounter for closed fracture: Secondary | ICD-10-CM | POA: Diagnosis not present

## 2023-01-29 DIAGNOSIS — S52102D Unspecified fracture of upper end of left radius, subsequent encounter for closed fracture with routine healing: Secondary | ICD-10-CM | POA: Diagnosis not present

## 2023-01-29 DIAGNOSIS — M25622 Stiffness of left elbow, not elsewhere classified: Secondary | ICD-10-CM | POA: Diagnosis not present

## 2023-01-29 DIAGNOSIS — S42492D Other displaced fracture of lower end of left humerus, subsequent encounter for fracture with routine healing: Secondary | ICD-10-CM | POA: Diagnosis not present

## 2023-02-12 DIAGNOSIS — S52102D Unspecified fracture of upper end of left radius, subsequent encounter for closed fracture with routine healing: Secondary | ICD-10-CM | POA: Diagnosis not present

## 2023-02-12 DIAGNOSIS — M25622 Stiffness of left elbow, not elsewhere classified: Secondary | ICD-10-CM | POA: Diagnosis not present

## 2023-02-12 DIAGNOSIS — S42492D Other displaced fracture of lower end of left humerus, subsequent encounter for fracture with routine healing: Secondary | ICD-10-CM | POA: Diagnosis not present

## 2023-02-17 ENCOUNTER — Encounter: Payer: Self-pay | Admitting: Gastroenterology

## 2023-02-18 DIAGNOSIS — S42402D Unspecified fracture of lower end of left humerus, subsequent encounter for fracture with routine healing: Secondary | ICD-10-CM | POA: Diagnosis not present

## 2023-02-19 DIAGNOSIS — M25622 Stiffness of left elbow, not elsewhere classified: Secondary | ICD-10-CM | POA: Diagnosis not present

## 2023-02-19 DIAGNOSIS — S42492D Other displaced fracture of lower end of left humerus, subsequent encounter for fracture with routine healing: Secondary | ICD-10-CM | POA: Diagnosis not present

## 2023-02-19 DIAGNOSIS — S52102D Unspecified fracture of upper end of left radius, subsequent encounter for closed fracture with routine healing: Secondary | ICD-10-CM | POA: Diagnosis not present

## 2023-02-25 DIAGNOSIS — S42492D Other displaced fracture of lower end of left humerus, subsequent encounter for fracture with routine healing: Secondary | ICD-10-CM | POA: Diagnosis not present

## 2023-02-25 DIAGNOSIS — M25622 Stiffness of left elbow, not elsewhere classified: Secondary | ICD-10-CM | POA: Diagnosis not present

## 2023-02-25 DIAGNOSIS — S52102D Unspecified fracture of upper end of left radius, subsequent encounter for closed fracture with routine healing: Secondary | ICD-10-CM | POA: Diagnosis not present

## 2023-03-11 ENCOUNTER — Encounter: Payer: Self-pay | Admitting: Gastroenterology

## 2023-03-18 DIAGNOSIS — S42402D Unspecified fracture of lower end of left humerus, subsequent encounter for fracture with routine healing: Secondary | ICD-10-CM | POA: Diagnosis not present

## 2023-03-23 DIAGNOSIS — M25622 Stiffness of left elbow, not elsewhere classified: Secondary | ICD-10-CM | POA: Diagnosis not present

## 2023-03-23 DIAGNOSIS — S42492D Other displaced fracture of lower end of left humerus, subsequent encounter for fracture with routine healing: Secondary | ICD-10-CM | POA: Diagnosis not present

## 2023-03-23 DIAGNOSIS — S52102D Unspecified fracture of upper end of left radius, subsequent encounter for closed fracture with routine healing: Secondary | ICD-10-CM | POA: Diagnosis not present

## 2023-04-08 DIAGNOSIS — S52102D Unspecified fracture of upper end of left radius, subsequent encounter for closed fracture with routine healing: Secondary | ICD-10-CM | POA: Diagnosis not present

## 2023-04-08 DIAGNOSIS — S42492D Other displaced fracture of lower end of left humerus, subsequent encounter for fracture with routine healing: Secondary | ICD-10-CM | POA: Diagnosis not present

## 2023-04-08 DIAGNOSIS — M25622 Stiffness of left elbow, not elsewhere classified: Secondary | ICD-10-CM | POA: Diagnosis not present

## 2023-04-23 DIAGNOSIS — S52102D Unspecified fracture of upper end of left radius, subsequent encounter for closed fracture with routine healing: Secondary | ICD-10-CM | POA: Diagnosis not present

## 2023-04-23 DIAGNOSIS — M25622 Stiffness of left elbow, not elsewhere classified: Secondary | ICD-10-CM | POA: Diagnosis not present

## 2023-04-23 DIAGNOSIS — S42492D Other displaced fracture of lower end of left humerus, subsequent encounter for fracture with routine healing: Secondary | ICD-10-CM | POA: Diagnosis not present

## 2023-04-29 DIAGNOSIS — S42402D Unspecified fracture of lower end of left humerus, subsequent encounter for fracture with routine healing: Secondary | ICD-10-CM | POA: Diagnosis not present

## 2023-05-10 ENCOUNTER — Ambulatory Visit (AMBULATORY_SURGERY_CENTER): Payer: BC Managed Care – PPO

## 2023-05-10 VITALS — Ht 65.0 in | Wt 145.0 lb

## 2023-05-10 DIAGNOSIS — Z86010 Personal history of colonic polyps: Secondary | ICD-10-CM

## 2023-05-10 MED ORDER — NA SULFATE-K SULFATE-MG SULF 17.5-3.13-1.6 GM/177ML PO SOLN: 1.0000 | Freq: Once | ORAL | 0 refills | Status: AC

## 2023-05-10 NOTE — Progress Notes (Signed)
Pre visit completed via phone call; Patient verified name, DOB, and address;  No egg or soy allergy known to patient;  No issues known to pt with past sedation with any surgeries or procedures; Patient denies ever being told they had issues or difficulty with intubation;  No FH of Malignant Hyperthermia; Pt is not on diet pills; Pt is not on home 02;  Pt is not on blood thinners;  Pt denies issues with constipation  No A fib or A flutter; Have any cardiac testing pending--NO Pt instructed to use [email or web address] or GoodRx for a price reduction on prep   Insurance verified during PV appt=BCBS PPO  Patient's chart reviewed by Cathlyn Parsons CNRA prior to previsit and patient appropriate for the [facility].  Previsit completed and red dot placed by patient's name on their procedure day (on provider's schedule).    Instructions sent to patient via MyChart as well as printed out along with [facility] GoodRx coupon per patient request and mailed to the patient;

## 2023-06-01 ENCOUNTER — Encounter: Payer: Self-pay | Admitting: Gastroenterology

## 2023-06-08 ENCOUNTER — Encounter: Payer: Self-pay | Admitting: Gastroenterology

## 2023-06-08 ENCOUNTER — Ambulatory Visit (AMBULATORY_SURGERY_CENTER): Payer: BC Managed Care – PPO | Admitting: Gastroenterology

## 2023-06-08 VITALS — BP 130/82 | HR 59 | Temp 98.0°F | Resp 9 | Ht 65.0 in | Wt 145.0 lb

## 2023-06-08 DIAGNOSIS — D127 Benign neoplasm of rectosigmoid junction: Secondary | ICD-10-CM

## 2023-06-08 DIAGNOSIS — Z86010 Personal history of colonic polyps: Secondary | ICD-10-CM

## 2023-06-08 DIAGNOSIS — Z1211 Encounter for screening for malignant neoplasm of colon: Secondary | ICD-10-CM | POA: Diagnosis not present

## 2023-06-08 DIAGNOSIS — K635 Polyp of colon: Secondary | ICD-10-CM | POA: Diagnosis not present

## 2023-06-08 DIAGNOSIS — Z09 Encounter for follow-up examination after completed treatment for conditions other than malignant neoplasm: Secondary | ICD-10-CM | POA: Diagnosis not present

## 2023-06-08 MED ORDER — SODIUM CHLORIDE 0.9 % IV SOLN: 500.0000 mL | INTRAVENOUS | Status: DC

## 2023-06-08 NOTE — Progress Notes (Signed)
Called to room to assist during endoscopic procedure.  Patient ID and intended procedure confirmed with present staff. Received instructions for my participation in the procedure from the performing physician.  

## 2023-06-08 NOTE — Op Note (Signed)
[facility] Patient Name: Robin Tapia Procedure Date: 06/08/2023 7:59 AM MRN: [id number] Endoscopist: Corliss Parish , MD, [id number] Age: 57 Referring MD:  Date of Birth: Oct 20, 1966 Gender: Female Account #: [id number] Procedure:                Colonoscopy Indications:              Surveillance: Personal history of adenomatous                            polyps on last colonoscopy 5 years ago Medicines:                Monitored Anesthesia Care Procedure:                Pre-Anesthesia Assessment:                           - Prior to the procedure, a History and Physical                            was performed, and patient medications and                            allergies were reviewed. The patient's tolerance of                            previous anesthesia was also reviewed. The risks                            and benefits of the procedure and the sedation                            options and risks were discussed with the patient.                            All questions were answered, and informed consent                            was obtained. Prior Anticoagulants: The patient has                            taken no anticoagulant or antiplatelet agents                            except for aspirin. ASA Grade Assessment: II - A                            patient with mild systemic disease. After reviewing                            the risks and benefits, the patient was deemed in                            satisfactory condition to undergo the procedure.  After obtaining informed consent, the colonoscope                            was passed under direct vision. Throughout the                            procedure, the patient's blood pressure, pulse, and                            oxygen saturations were monitored continuously. The                            CF [id number] #[id number] was introduced through the anus                             and advanced to the 3 cm into the ileum. The                            colonoscopy was performed without difficulty. The                            patient tolerated the procedure. The quality of the                            bowel preparation was good. The terminal ileum,                            ileocecal valve, appendiceal orifice, and rectum                            were photographed. Scope In: 8:06:32 AM Scope Out: 8:18:27 AM Scope Withdrawal Time: 0 hours 9 minutes 16 seconds  Total Procedure Duration: 0 hours 11 minutes 55 seconds  Findings:                 Skin tags were found on perianal exam.                           The digital rectal exam findings include                            hemorrhoids. Pertinent negatives include no                            palpable rectal lesions.                           The terminal ileum and ileocecal valve appeared                            normal.                           A 4 mm polyp was found in the recto-sigmoid colon.  The polyp was sessile. The polyp was removed with a                            cold snare. Resection and retrieval were complete.                           Multiple small-mouthed diverticula were found in                            the recto-sigmoid colon and sigmoid colon.                           Normal mucosa was found in the entire colon                            otherwise.                           Non-bleeding non-thrombosed external and internal                            hemorrhoids were found during retroflexion, during                            perianal exam and during digital exam. The                            hemorrhoids were Grade II (internal hemorrhoids                            that prolapse but reduce spontaneously). Complications:            No immediate complications. Estimated Blood Loss:     Estimated blood loss was minimal. Impression:               -  Perianal skin tags found on perianal exam.                            Hemorrhoids found on digital rectal exam.                           - The examined portion of the ileum was normal.                           - One 4 mm polyp at the recto-sigmoid colon,                            removed with a cold snare. Resected and retrieved.                           - Diverticulosis in the recto-sigmoid colon and in                            the sigmoid colon.                           -  Normal mucosa in the entire examined colon                            otherwise.                           - Non-bleeding non-thrombosed external and internal                            hemorrhoids. Recommendation:           - The patient will be observed post-procedure,                            until all discharge criteria are met.                           - Discharge patient to home.                           - Patient has a contact number available for                            emergencies. The signs and symptoms of potential                            delayed complications were discussed with the                            patient. Return to normal activities tomorrow.                            Written discharge instructions were provided to the                            patient.                           - High fiber diet.                           - Use FiberCon 1-2 tablets PO daily.                           - Continue present medications.                           - Await pathology results.                           - Repeat colonoscopy in 5-7 years for surveillance                            and previous history of adenomatous colon polyps.                           - The findings and recommendations were discussed  with the patient.                           - The findings and recommendations were discussed                            with the patient's family. Corliss Parish, MD 06/08/2023 8:23:13 AM

## 2023-06-08 NOTE — Progress Notes (Signed)
Sedate, gd SR, tolerated procedure well, VSS, report to RN 

## 2023-06-08 NOTE — Progress Notes (Signed)
GASTROENTEROLOGY PROCEDURE H&P NOTE   Primary Care Physician: Kristian Covey, MD  HPI: Robin Tapia is a 57 y.o. female who presents for Colonoscopy for surveillance of previous polyps.  Past Medical History:  Diagnosis Date   Anxiety    not on meds   CAD S/P percutaneous coronary angioplasty 04/2013   - 100% D2, ~90% Cx-OM1; EF 45-50%   Dyslipidemia, goal LDL below 70    Reduced to 5 mg Crestor   Fainting spell    History of pneumonia ~2002   "walking PNA   NSTEMI - S/P PCI and stenting x 2 of Circumflex and Diagonal Branch w/ DES 04/12/13 04/12/2013   Pathophysiology consistent with STEMI with occluded diagonal branch he   Piriformis muscle pain    LLE   Presence of drug coated stent in a Diagonal branch of the LAD coronary artery 04/12/2013   Xence EXpd DES 2.25 mm x 15 mm - post-dil to 2.4 mm   Presence of drug coated stent in left circumflex coronary artery 04/12/2013   Xience EXpd DES 3.0 mm x 23 mm - post-dil to 3.25 mm   Past Surgical History:  Procedure Laterality Date   CORONARY ANGIOPLASTY WITH STENT PLACEMENT  03/12/2013   Xience Exp. 3.55mm x 23 mm (post-dil to 3.40mm) - OM1; Xience Exp. 2.25 mm x 15 mm - Diag 2 (post-dil 2.4 mm)   FINGER FRACTURE SURGERY Left 2008   LEFT HEART CATHETERIZATION WITH CORONARY ANGIOGRAM N/A 04/12/2013   Procedure: LEFT HEART CATHETERIZATION WITH CORONARY ANGIOGRAM;  Surgeon: Runell Gess, MD;  Location: [facility] CATH LAB;  Service: Cardiovascular;  Laterality: N/A;   NM MYOVIEW LTD  05/30/2013   Low Risk nuclear stress test with small area of mild perfusion defect in the anterolateral wall. Normal function. -- Consistent with diagonal branch disease   ORIF RADIAL HEAD / NECK FRACTURE  2024   Current Outpatient Medications  Medication Sig Dispense Refill   aspirin EC 81 MG tablet Take 1 tablet (81 mg total) by mouth every other day. 90 tablet 3   Current Facility-Administered Medications  Medication Dose Route  Frequency Provider Last Rate Last Admin   0.9 %  sodium chloride infusion  500 mL Intravenous Once Rachael Fee, MD       0.9 %  sodium chloride infusion  500 mL Intravenous Continuous Mansouraty, Netty Starring., MD        Current Outpatient Medications:    aspirin EC 81 MG tablet, Take 1 tablet (81 mg total) by mouth every other day., Disp: 90 tablet, Rfl: 3  Current Facility-Administered Medications:    0.9 %  sodium chloride infusion, 500 mL, Intravenous, Once, Rachael Fee, MD   0.9 %  sodium chloride infusion, 500 mL, Intravenous, Continuous, Mansouraty, Netty Starring., MD Allergies  Allergen Reactions   Crestor [Rosuvastatin]     Muscle aches at 10 mg daily, 5 mg daily, and 5 mg twice weekly   Lipitor [Atorvastatin]     Muscle aches    Family History  Problem Relation Age of Onset   Alcohol abuse Mother    Hyperlipidemia Mother    Hypertension Father    Hyperlipidemia Father    Colon cancer Neg Hx    Rectal cancer Neg Hx    Stomach cancer Neg Hx    Social History   Socioeconomic History   Marital status: Married    Spouse name: Not on file   Number of children: Not on file  Years of education: Not on file   Highest education level: Not on file  Occupational History   Not on file  Tobacco Use   Smoking status: Never   Smokeless tobacco: Never  Vaping Use   Vaping status: Never Used  Substance and Sexual Activity   Alcohol use: Yes    Comment: once per month   Drug use: No   Sexual activity: Yes    Birth control/protection: None  Other Topics Concern   Not on file  Social History Narrative   Socially she is in her second marriage. She has 4 children ages 75 and 43 from her first marriage ages 14 and 36 for her second marriage. Has no tobacco history but she admits to a lifelong commitment to exercise and running competitively. She is eagerly and has been getting back into full training, and competitions.   Social Determinants of Health   Financial  Resource Strain: Not on file  Food Insecurity: Not on file  Transportation Needs: Not on file  Physical Activity: Not on file  Stress: Not on file  Social Connections: Not on file  Intimate Partner Violence: Not on file    Physical Exam: Today's Vitals   06/08/23 0714 06/08/23 0715  BP: (!) 165/95   Pulse: 76   Temp: 98 F (36.7 C) 98 F (36.7 C)  SpO2: 100%   Weight: 145 lb (65.8 kg)   Height: 5\' 5"  (1.651 m)    Body mass index is 24.13 kg/m. GEN: NAD EYE: Sclerae anicteric ENT: MMM CV: Non-tachycardic GI: Soft, NT/ND NEURO:  Alert & Oriented x 3  Lab Results: No results for input(s): "WBC", "HGB", "HCT", "PLT" in the last 72 hours. BMET No results for input(s): "NA", "K", "CL", "CO2", "GLUCOSE", "BUN", "CREATININE", "CALCIUM" in the last 72 hours. LFT No results for input(s): "PROT", "ALBUMIN", "AST", "ALT", "ALKPHOS", "BILITOT", "BILIDIR", "IBILI" in the last 72 hours. PT/INR No results for input(s): "LABPROT", "INR" in the last 72 hours.   Impression / Plan: This is a 57 y.o.female who presents for Colonoscopy for surveillance of previous polyps.  The risks and benefits of endoscopic evaluation/treatment were discussed with the patient and/or family; these include but are not limited to the risk of perforation, infection, bleeding, missed lesions, lack of diagnosis, severe illness requiring hospitalization, as well as anesthesia and sedation related illnesses.  The patient's history has been reviewed, patient examined, no change in status, and deemed stable for procedure.  The patient and/or family is agreeable to proceed.    Corliss Parish, MD [facility] Office # [phone]

## 2023-06-08 NOTE — Progress Notes (Signed)
Pt's states no medical or surgical changes since previsit or office visit. 

## 2023-06-08 NOTE — Patient Instructions (Signed)
Resume previous (High Fiber) diet and medications. Awaiting pathology results. Repeat Colonoscopy date to be determined based on pathology results. Handouts provided [id number] polyps, Diverticulosis, Hemorrhoids and High fiber diet.  YOU HAD AN ENDOSCOPIC PROCEDURE TODAY AT THE [facility]:   Refer to the procedure report that was given to you for any specific questions about what was found during the examination.  If the procedure report does not answer your questions, please call your gastroenterologist to clarify.  If you requested that your care partner not be given the details of your procedure findings, then the procedure report has been included in a sealed envelope for you to review at your convenience later.  YOU SHOULD EXPECT: Some feelings of bloating in the abdomen. Passage of more gas than usual.  Walking can help get rid of the air that was put into your GI tract during the procedure and reduce the bloating. If you had a lower endoscopy (such as a colonoscopy or flexible sigmoidoscopy) you may notice spotting of blood in your stool or on the toilet paper. If you underwent a bowel prep for your procedure, you may not have a normal bowel movement for a few days.  Please Note:  You might notice some irritation and congestion in your nose or some drainage.  This is from the oxygen used during your procedure.  There is no need for concern and it should clear up in a day or so.  SYMPTOMS TO REPORT IMMEDIATELY:  Following lower endoscopy (colonoscopy or flexible sigmoidoscopy):  Excessive amounts of blood in the stool  Significant tenderness or worsening of abdominal pains  Swelling of the abdomen that is new, acute  Fever of 100F or higher   For urgent or emergent issues, a gastroenterologist can be reached at any hour by calling [phone]. Do not use MyChart messaging for urgent concerns.    DIET:  We do recommend a small meal at first, but then you may proceed to  your regular diet.  Drink plenty of fluids but you should avoid alcoholic beverages for 24 hours.  ACTIVITY:  You should plan to take it easy for the rest of today and you should NOT DRIVE or use heavy machinery until tomorrow (because of the sedation medicines used during the test).    FOLLOW UP: Our staff will call the number listed on your records the next business day following your procedure.  We will call around 7:15- 8:00 am to check on you and address any questions or concerns that you may have regarding the information given to you following your procedure. If we do not reach you, we will leave a message.     If any biopsies were taken you will be contacted by phone or by letter within the next 1-3 weeks.  Please call us at [phone] if you have not heard about the biopsies in 3 weeks.    SIGNATURES/CONFIDENTIALITY: You and/or your care partner have signed paperwork which will be entered into your electronic medical record.  These signatures attest to the fact that that the information above on your After Visit Summary has been reviewed and is understood.  Full responsibility of the confidentiality of this discharge information lies with you and/or your care-partner.

## 2023-06-09 ENCOUNTER — Telehealth: Payer: Self-pay

## 2023-06-09 NOTE — Telephone Encounter (Signed)
  Follow up Call-     06/08/2023    7:15 AM  Call back number  Post procedure Call Back phone  # [phone]  Permission to leave phone message Yes     Patient questions:  Do you have a fever, pain , or abdominal swelling? No. Pain Score  0 *  Have you tolerated food without any problems? Yes.    Have you been able to return to your normal activities? Yes.    Do you have any questions about your discharge instructions: Diet   No. Medications  No. Follow up visit  No.  Do you have questions or concerns about your Care? No.  Actions: * If pain score is 4 or above: No action needed, pain <4.

## 2023-06-11 ENCOUNTER — Encounter: Payer: Self-pay | Admitting: Gastroenterology

## 2023-06-30 DIAGNOSIS — Z01419 Encounter for gynecological examination (general) (routine) without abnormal findings: Secondary | ICD-10-CM | POA: Diagnosis not present

## 2023-06-30 DIAGNOSIS — Z6825 Body mass index (BMI) 25.0-25.9, adult: Secondary | ICD-10-CM | POA: Diagnosis not present

## 2023-10-22 ENCOUNTER — Telehealth: Payer: Self-pay | Admitting: Family Medicine

## 2023-10-22 NOTE — Telephone Encounter (Addendum)
Pt has not be seen in over 4 years and pt is having stomach spasms . I explain to patient I am unable to make an appt due to last time seen in office and dr burchette is not excepting new patients. Please advise patient request md to call her

## 2023-10-27 NOTE — Telephone Encounter (Signed)
Pt has been sch for 11-15-2023

## 2023-10-27 NOTE — Telephone Encounter (Signed)
Lmom for pt to sch

## 2023-11-15 ENCOUNTER — Ambulatory Visit: Payer: BC Managed Care – PPO | Admitting: Family Medicine

## 2023-11-15 ENCOUNTER — Encounter: Payer: Self-pay | Admitting: Family Medicine

## 2023-11-15 VITALS — BP 110/86 | HR 64 | Temp 97.8°F | Ht 63.5 in | Wt 155.0 lb

## 2023-11-15 DIAGNOSIS — I2510 Atherosclerotic heart disease of native coronary artery without angina pectoris: Secondary | ICD-10-CM

## 2023-11-15 DIAGNOSIS — R030 Elevated blood-pressure reading, without diagnosis of hypertension: Secondary | ICD-10-CM | POA: Diagnosis not present

## 2023-11-15 DIAGNOSIS — Z9861 Coronary angioplasty status: Secondary | ICD-10-CM

## 2023-11-15 DIAGNOSIS — E785 Hyperlipidemia, unspecified: Secondary | ICD-10-CM | POA: Diagnosis not present

## 2023-11-15 LAB — CBC WITH DIFFERENTIAL/PLATELET
Basophils Absolute: 0.1 10*3/uL (ref 0.0–0.1)
Basophils Relative: 0.7 % (ref 0.0–3.0)
Eosinophils Absolute: 0 10*3/uL (ref 0.0–0.7)
Eosinophils Relative: 0.4 % (ref 0.0–5.0)
HCT: 42.7 % (ref 36.0–46.0)
Hemoglobin: 14.4 g/dL (ref 12.0–15.0)
Lymphocytes Relative: 22.8 % (ref 12.0–46.0)
Lymphs Abs: 1.8 10*3/uL (ref 0.7–4.0)
MCHC: 33.8 g/dL (ref 30.0–36.0)
MCV: 92.4 fL (ref 78.0–100.0)
Monocytes Absolute: 0.4 10*3/uL (ref 0.1–1.0)
Monocytes Relative: 5.2 % (ref 3.0–12.0)
Neutro Abs: 5.5 10*3/uL (ref 1.4–7.7)
Neutrophils Relative %: 70.9 % (ref 43.0–77.0)
Platelets: 275 10*3/uL (ref 150.0–400.0)
RBC: 4.62 Mil/uL (ref 3.87–5.11)
RDW: 13.3 % (ref 11.5–15.5)
WBC: 7.7 10*3/uL (ref 4.0–10.5)

## 2023-11-15 LAB — COMPREHENSIVE METABOLIC PANEL
ALT: 18 U/L (ref 0–35)
AST: 24 U/L (ref 0–37)
Albumin: 4.6 g/dL (ref 3.5–5.2)
Alkaline Phosphatase: 77 U/L (ref 39–117)
BUN: 13 mg/dL (ref 6–23)
CO2: 26 meq/L (ref 19–32)
Calcium: 9.8 mg/dL (ref 8.4–10.5)
Chloride: 104 meq/L (ref 96–112)
Creatinine, Ser: 0.8 mg/dL (ref 0.40–1.20)
GFR: 81.44 mL/min (ref >60.00)
Glucose, Bld: 89 mg/dL (ref 70–99)
Potassium: 3.9 meq/L (ref 3.5–5.1)
Sodium: 141 meq/L (ref 135–145)
Total Bilirubin: 0.7 mg/dL (ref 0.2–1.2)
Total Protein: 7.5 g/dL (ref 6.0–8.3)

## 2023-11-15 LAB — LIPID PANEL
Cholesterol: 273 mg/dL — ABNORMAL HIGH (ref 0–200)
HDL: 56.4 mg/dL (ref >39.00)
LDL Cholesterol: 179 mg/dL — ABNORMAL HIGH (ref 0–99)
NonHDL: 216.36
Total CHOL/HDL Ratio: 5
Triglycerides: 186 mg/dL — ABNORMAL HIGH (ref 0.0–149.0)
VLDL: 37.2 mg/dL (ref 0.0–40.0)

## 2023-11-15 LAB — TSH: TSH: 1.43 u[IU]/mL (ref 0.35–5.50)

## 2023-11-15 NOTE — Progress Notes (Signed)
 Established Patient Office Visit  Subjective   Patient ID: Robin Tapia, female    DOB: 11/09/1966  Age: 58 y.o. MRN: [id number]  Chief Complaint  Patient presents with   Establish Care    HPI  Robin Tapia is seen to reestablish care.  She has past medical history significant for CAD with NSTEMI and placement of 2 stents back in 2014.  She has history of hyperlipidemia with prior intolerance with statins and apparently PCSK9 inhibitors as well.  She is an avid runner and currently training for half marathon. Runs 40+ miles per week.   No recent chest pains with exercise.  She had called recently with some symptoms of possible abdominal spasm .  She states back in July she went to [place].  When she came back she had colonoscopy.  She had sore throat for several weeks and had been eating quite a bit of salsa and chips with that on her trip as well as drinking more [facility] and in hindsight wonders if she had had some GERD symptoms.  No dysphagia.  No stool changes.  She started back B12 supplement and as soon as she did her symptoms abated.  She has had none since then.  Blood pressure is quite elevated today.  She states this is very atypical for her to be this high.  She may have had some whitecoat syndrome in the past but generally blood pressures been very well-controlled.  She does have a couple of home blood pressure cuffs at home.  Has been taking some meloxicam past few days.  No regular alcohol use.  Does not add a lot of sodium generally.  Past Medical History:  Diagnosis Date   Anxiety    not on meds   CAD S/P percutaneous coronary angioplasty 04/2013   - 100% D2, ~90% Cx-OM1; EF 45-50%   Dyslipidemia, goal LDL below 70    Reduced to 5 mg Crestor    Fainting spell    History of pneumonia ~2002   walking PNA   NSTEMI - S/P PCI and stenting x 2 of Circumflex and Diagonal Branch w/ DES 04/12/13 04/12/2013   Pathophysiology consistent with STEMI with occluded  diagonal branch he   Piriformis muscle pain    LLE   Presence of drug coated stent in a Diagonal branch of the LAD coronary artery 04/12/2013   Xence EXpd DES 2.25 mm x 15 mm - post-dil to 2.4 mm   Presence of drug coated stent in left circumflex coronary artery 04/12/2013   Xience EXpd DES 3.0 mm x 23 mm - post-dil to 3.25 mm   Past Surgical History:  Procedure Laterality Date   CORONARY ANGIOPLASTY WITH STENT PLACEMENT  03/12/2013   Xience Exp. 3.23mm x 23 mm (post-dil to 3.62mm) - OM1; Xience Exp. 2.25 mm x 15 mm - Diag 2 (post-dil 2.4 mm)   FINGER FRACTURE SURGERY Left 2008   LEFT HEART CATHETERIZATION WITH CORONARY ANGIOGRAM N/A 04/12/2013   Procedure: LEFT HEART CATHETERIZATION WITH CORONARY ANGIOGRAM;  Surgeon: Dorn JINNY Lesches, MD;  Location: [facility] CATH LAB;  Service: Cardiovascular;  Laterality: N/A;   NM MYOVIEW  LTD  05/30/2013   Low Risk nuclear stress test with small area of mild perfusion defect in the anterolateral wall. Normal function. -- Consistent with diagonal branch disease   ORIF RADIAL HEAD / NECK FRACTURE  2024    reports that she has never smoked. She has never used smokeless tobacco. She reports current alcohol use. She reports  that she does not use drugs. family history includes Alcohol abuse in her mother; Hyperlipidemia in her father and mother; Hypertension in her father. Allergies  Allergen Reactions   Crestor  [Rosuvastatin ]     Muscle aches at 10 mg daily, 5 mg daily, and 5 mg twice weekly   Lipitor  [Atorvastatin ]     Muscle aches     Review of Systems  Constitutional:  Negative for chills, fever and weight loss.  Respiratory:  Negative for shortness of breath.   Cardiovascular:  Negative for chest pain.  Gastrointestinal:  Negative for blood in stool, constipation, melena, nausea and vomiting.      Objective:     BP 110/86   Pulse 64   Temp 97.8 F (36.6 C) (Oral)   Ht 5' 3.5 (1.613 m)   Wt 155 lb (70.3 kg)   LMP 01/24/2018   SpO2 94%   BMI  27.03 kg/m  BP Readings from Last 3 Encounters:  11/15/23 110/86  06/08/23 130/82  12/28/22 138/80   Wt Readings from Last 3 Encounters:  11/15/23 155 lb (70.3 kg)  06/08/23 145 lb (65.8 kg)  05/10/23 145 lb (65.8 kg)      Physical Exam Vitals reviewed.  Constitutional:      General: She is not in acute distress.    Appearance: She is not ill-appearing.  Cardiovascular:     Rate and Rhythm: Normal rate and regular rhythm.  Pulmonary:     Effort: Pulmonary effort is normal.     Breath sounds: Normal breath sounds. No wheezing or rales.  Abdominal:     General: There is no distension.     Palpations: Abdomen is soft.     Tenderness: There is no abdominal tenderness. There is no guarding or rebound.     Hernia: No hernia is present.  Neurological:     Mental Status: She is alert.      Results for orders placed or performed in visit on 11/15/23  CBC with Differential/Platelet  Result Value Ref Range   WBC 7.7 4.0 - 10.5 K/uL   RBC 4.62 3.87 - 5.11 Mil/uL   Hemoglobin 14.4 12.0 - 15.0 g/dL   HCT 57.2 63.9 - 53.9 %   MCV 92.4 78.0 - 100.0 fl   MCHC 33.8 30.0 - 36.0 g/dL   RDW 86.6 88.4 - 84.4 %   Platelets 275.0 150.0 - 400.0 K/uL   Neutrophils Relative % 70.9 43.0 - 77.0 %   Lymphocytes Relative 22.8 12.0 - 46.0 %   Monocytes Relative 5.2 3.0 - 12.0 %   Eosinophils Relative 0.4 0.0 - 5.0 %   Basophils Relative 0.7 0.0 - 3.0 %   Neutro Abs 5.5 1.4 - 7.7 K/uL   Lymphs Abs 1.8 0.7 - 4.0 K/uL   Monocytes Absolute 0.4 0.1 - 1.0 K/uL   Eosinophils Absolute 0.0 0.0 - 0.7 K/uL   Basophils Absolute 0.1 0.0 - 0.1 K/uL  TSH  Result Value Ref Range   TSH 1.43 0.35 - 5.50 uIU/mL  CMP  Result Value Ref Range   Sodium 141 135 - 145 mEq/L   Potassium 3.9 3.5 - 5.1 mEq/L   Chloride 104 96 - 112 mEq/L   CO2 26 19 - 32 mEq/L   Glucose, Bld 89 70 - 99 mg/dL   BUN 13 6 - 23 mg/dL   Creatinine, Ser 9.19 0.40 - 1.20 mg/dL   Total Bilirubin 0.7 0.2 - 1.2 mg/dL   Alkaline  Phosphatase 77 39 -  117 U/L   AST 24 0 - 37 U/L   ALT 18 0 - 35 U/L   Total Protein 7.5 6.0 - 8.3 g/dL   Albumin 4.6 3.5 - 5.2 g/dL   GFR 18.55 >39.99 mL/min   Calcium  9.8 8.4 - 10.5 mg/dL  Lipid panel  Result Value Ref Range   Cholesterol 273 (H) 0 - 200 mg/dL   Triglycerides 813.9 (H) 0.0 - 149.0 mg/dL   HDL 43.59 >60.99 mg/dL   VLDL 62.7 0.0 - 59.9 mg/dL   LDL Cholesterol 820 (H) 0 - 99 mg/dL   Total CHOL/HDL Ratio 5    NonHDL 216.36     Last metabolic panel Lab Results  Component Value Date   GLUCOSE 89 11/15/2023   NA 141 11/15/2023   K 3.9 11/15/2023   CL 104 11/15/2023   CO2 26 11/15/2023   BUN 13 11/15/2023   CREATININE 0.80 11/15/2023   GFR 81.44 11/15/2023   CALCIUM  9.8 11/15/2023   PROT 7.5 11/15/2023   ALBUMIN 4.6 11/15/2023   LABGLOB 2.3 12/26/2021   AGRATIO 2.1 12/26/2021   BILITOT 0.7 11/15/2023   ALKPHOS 77 11/15/2023   AST 24 11/15/2023   ALT 18 11/15/2023   Last lipids Lab Results  Component Value Date   CHOL 273 (H) 11/15/2023   HDL 56.40 11/15/2023   LDLCALC 179 (H) 11/15/2023   TRIG 186.0 (H) 11/15/2023   CHOLHDL 5 11/15/2023      The ASCVD Risk score (Arnett DK, et al., 2019) failed to calculate for the following reasons:   Risk score cannot be calculated because patient has a medical history suggesting prior/existing ASCVD    Assessment & Plan:   Problem List Items Addressed This Visit   None Visit Diagnoses       Elevated blood pressure reading    -  Primary   Relevant Orders   CMP (Completed)   TSH (Completed)   CBC with Differential/Platelet (Completed)     Hyperlipidemia, unspecified hyperlipidemia type       Relevant Orders   Lipid panel (Completed)     Robin Tapia is seen today to reestablish care.  She had some recent abdominal symptoms which interestingly resolved after she started back her B12 supplement.  Had had some dietary changes over the summer in hindsight may have had some GERD symptoms.  Abdominal symptoms  resolved at this time.  Blood pressures quite high today.  Repeat left arm seated 180/80 and right arm 190/80.  After about 10 minutes of further rest, we repeated left arm and obtained 162/80.  -Recommend set up 2-week follow-up.  Monitor blood pressure at home frequently in the meantime and record readings and bring in with her cuff for comparison in 2 weeks.  If consistently up at that point consider initiating either ACE or ARB Try to leave off Meloxicam or other NSAIDS- which could be contributing.  -Also obtaining follow-up lipids today.  She is currently not on any antilipid therapy.  Recommend close follow-up with cardiology. ?consider trial of Leqvio .  She has scheduled follow up with Cardiology in March.    Return in about 2 weeks (around 11/29/2023).    Wolm Scarlet, MD

## 2023-11-15 NOTE — Patient Instructions (Signed)
 Monitor home BP and record  Bring in your BP cuff at follow up in 2 weeks.

## 2023-11-29 ENCOUNTER — Encounter: Payer: Self-pay | Admitting: Family Medicine

## 2023-11-29 ENCOUNTER — Ambulatory Visit (INDEPENDENT_AMBULATORY_CARE_PROVIDER_SITE_OTHER): Payer: BC Managed Care – PPO | Admitting: Family Medicine

## 2023-11-29 VITALS — BP 150/78 | HR 100 | Temp 98.2°F | Wt 147.9 lb

## 2023-11-29 DIAGNOSIS — E785 Hyperlipidemia, unspecified: Secondary | ICD-10-CM

## 2023-11-29 DIAGNOSIS — R030 Elevated blood-pressure reading, without diagnosis of hypertension: Secondary | ICD-10-CM

## 2023-11-29 NOTE — Progress Notes (Signed)
Established Patient Office Visit  Subjective   Patient ID: Robin Tapia, female    DOB: [birth year]  Age: 58 y.o. MRN: [id number]  Chief Complaint  Patient presents with   Medical Management of Chronic Issues    HPI   Robin Tapia is seen for 2-week follow-up.  She had quite elevated blood pressure last visit.  She had been eating more sodium than usual and her weight was up higher than it had been previously.  Her weight is already down about 7-1/2 pounds from last visit.  She has been more diligent with watching sodium intake.  She is still exercising regularly and currently training for half marathon.  She brings in her cuff today to compare with ours.  She has had several readings around 120s systolic and 80s diastolic.  Currently not taking anything for blood pressure.  She was here to basically reestablish care couple weeks ago.  We obtained labs.  Her CBC, TSH, CMP were all essentially normal.  Cholesterol was very high with total cholesterol 273, triglycerides 186, LDL 179.  HDL 56.  She has had previous intolerance with statins, Zetia, and PCSK9 inhibitors.  She has follow-up with cardiology in March.  They have previously discussed possible use of inclisiran.  She has some reservations because of prior intolerance with lipid-lowering therapy but is willing to consider.  She plans to discuss this with cardiology.  Denies any recent chest pains.  She does have history of CAD  Past Medical History:  Diagnosis Date   Anxiety    not on meds   CAD S/P percutaneous coronary angioplasty 04/2013   - 100% D2, ~90% Cx-OM1; EF 45-50%   Dyslipidemia, goal LDL below 70    Reduced to 5 mg Crestor   Fainting spell    History of pneumonia ~2002   "walking PNA   NSTEMI - S/P PCI and stenting x 2 of Circumflex and Diagonal Branch w/ DES 04/12/13 04/12/2013   Pathophysiology consistent with STEMI with occluded diagonal branch he   Piriformis muscle pain    LLE   Presence of drug  coated stent in a Diagonal branch of the LAD coronary artery 04/12/2013   Xence EXpd DES 2.25 mm x 15 mm - post-dil to 2.4 mm   Presence of drug coated stent in left circumflex coronary artery 04/12/2013   Xience EXpd DES 3.0 mm x 23 mm - post-dil to 3.25 mm   Past Surgical History:  Procedure Laterality Date   CORONARY ANGIOPLASTY WITH STENT PLACEMENT  03/12/2013   Xience Exp. 3.27mm x 23 mm (post-dil to 3.47mm) - OM1; Xience Exp. 2.25 mm x 15 mm - Diag 2 (post-dil 2.4 mm)   FINGER FRACTURE SURGERY Left 2008   LEFT HEART CATHETERIZATION WITH CORONARY ANGIOGRAM N/A 04/12/2013   Procedure: LEFT HEART CATHETERIZATION WITH CORONARY ANGIOGRAM;  Surgeon: Runell Gess, MD;  Location: [facility] CATH LAB;  Service: Cardiovascular;  Laterality: N/A;   NM MYOVIEW LTD  05/30/2013   Low Risk nuclear stress test with small area of mild perfusion defect in the anterolateral wall. Normal function. -- Consistent with diagonal branch disease   ORIF RADIAL HEAD / NECK FRACTURE  2024    reports that she has never smoked. She has never used smokeless tobacco. She reports current alcohol use. She reports that she does not use drugs. family history includes Alcohol abuse in her mother; Hyperlipidemia in her father and mother; Hypertension in her father. Allergies  Allergen Reactions   Crestor [Rosuvastatin]  Muscle aches at 10 mg daily, 5 mg daily, and 5 mg twice weekly   Lipitor [Atorvastatin]     Muscle aches     Review of Systems  Constitutional:  Negative for malaise/fatigue.  Eyes:  Negative for blurred vision.  Respiratory:  Negative for shortness of breath.   Cardiovascular:  Negative for chest pain.  Neurological:  Negative for dizziness, weakness and headaches.      Objective:     BP (!) 150/78 (BP Location: Left Arm, Cuff Size: Normal)   Pulse 100   Temp 98.2 F (36.8 C) (Oral)   Wt 147 lb 14.4 oz (67.1 kg)   LMP 01/24/2018   SpO2 99%   BMI 25.79 kg/m  BP Readings from Last 3  Encounters:  11/29/23 (!) 150/78  11/15/23 110/86  06/08/23 130/82   Wt Readings from Last 3 Encounters:  11/29/23 147 lb 14.4 oz (67.1 kg)  11/15/23 155 lb (70.3 kg)  06/08/23 145 lb (65.8 kg)      Physical Exam Vitals reviewed.  Constitutional:      Appearance: She is well-developed.  Eyes:     Pupils: Pupils are equal, round, and reactive to light.  Neck:     Thyroid: No thyromegaly.     Vascular: No JVD.  Cardiovascular:     Rate and Rhythm: Normal rate and regular rhythm.     Heart sounds:     No gallop.  Pulmonary:     Effort: Pulmonary effort is normal. No respiratory distress.     Breath sounds: Normal breath sounds. No wheezing or rales.  Musculoskeletal:     Cervical back: Neck supple.  Neurological:     Mental Status: She is alert.      No results found for any visits on 11/29/23.  Last CBC Lab Results  Component Value Date   WBC 7.7 11/15/2023   HGB 14.4 11/15/2023   HCT 42.7 11/15/2023   MCV 92.4 11/15/2023   MCH 30.0 05/02/2013   RDW 13.3 11/15/2023   PLT 275.0 11/15/2023   Last metabolic panel Lab Results  Component Value Date   GLUCOSE 89 11/15/2023   NA 141 11/15/2023   K 3.9 11/15/2023   CL 104 11/15/2023   CO2 26 11/15/2023   BUN 13 11/15/2023   CREATININE 0.80 11/15/2023   GFR 81.44 11/15/2023   CALCIUM 9.8 11/15/2023   PROT 7.5 11/15/2023   ALBUMIN 4.6 11/15/2023   LABGLOB 2.3 12/26/2021   AGRATIO 2.1 12/26/2021   BILITOT 0.7 11/15/2023   ALKPHOS 77 11/15/2023   AST 24 11/15/2023   ALT 18 11/15/2023   Last lipids Lab Results  Component Value Date   CHOL 273 (H) 11/15/2023   HDL 56.40 11/15/2023   LDLCALC 179 (H) 11/15/2023   TRIG 186.0 (H) 11/15/2023   CHOLHDL 5 11/15/2023   Last thyroid functions Lab Results  Component Value Date   TSH 1.43 11/15/2023      The ASCVD Risk score (Arnett DK, et al., 2019) failed to calculate for the following reasons:   Risk score cannot be calculated because patient has a  medical history suggesting prior/existing ASCVD    Assessment & Plan:   #1 elevated blood pressure currently not treated with medication.  Her blood pressure did come down substantially from a week ago.  Suspected whitecoat syndrome.  We obtained several readings with our cuff and hers and were getting consistently high readings with her cuff, yet her blood pressure readings at home have been  fairly well-controlled.  -Continue nonpharmacologic management with weight control and sodium reduction and continued regular aerobic exercise.  She would like to get her weight down several more pounds which should help with blood pressure as well  #2 severe hyperlipidemia with history of known CAD.  Intolerance with multiple prior medications including statin, Zetia, PCSK9 inhibitors.  Consider inclisiran.  She will discuss with cardiology  Evelena Peat, MD

## 2023-11-30 DIAGNOSIS — D2262 Melanocytic nevi of left upper limb, including shoulder: Secondary | ICD-10-CM | POA: Diagnosis not present

## 2023-11-30 DIAGNOSIS — L720 Epidermal cyst: Secondary | ICD-10-CM | POA: Diagnosis not present

## 2023-11-30 DIAGNOSIS — D2261 Melanocytic nevi of right upper limb, including shoulder: Secondary | ICD-10-CM | POA: Diagnosis not present

## 2023-11-30 DIAGNOSIS — L918 Other hypertrophic disorders of the skin: Secondary | ICD-10-CM | POA: Diagnosis not present

## 2024-01-05 ENCOUNTER — Other Ambulatory Visit: Payer: Self-pay | Admitting: Obstetrics and Gynecology

## 2024-01-05 DIAGNOSIS — Z1231 Encounter for screening mammogram for malignant neoplasm of breast: Secondary | ICD-10-CM

## 2024-01-24 ENCOUNTER — Ambulatory Visit: Payer: BC Managed Care – PPO | Admitting: Cardiology

## 2024-01-25 ENCOUNTER — Ambulatory Visit: Payer: BC Managed Care – PPO

## 2024-02-11 ENCOUNTER — Ambulatory Visit
Admission: RE | Admit: 2024-02-11 | Discharge: 2024-02-11 | Disposition: A | Discharge status: Home / Self Care | Source: Ambulatory Visit | Attending: Obstetrics and Gynecology | Admitting: Obstetrics and Gynecology

## 2024-02-11 DIAGNOSIS — Z1231 Encounter for screening mammogram for malignant neoplasm of breast: Secondary | ICD-10-CM

## 2024-03-27 ENCOUNTER — Encounter: Payer: Self-pay | Admitting: Cardiology

## 2024-03-27 ENCOUNTER — Ambulatory Visit: Attending: Cardiology | Admitting: Cardiology

## 2024-03-27 VITALS — BP 132/74 | HR 72 | Ht 65.0 in | Wt 149.2 lb

## 2024-03-27 DIAGNOSIS — E785 Hyperlipidemia, unspecified: Secondary | ICD-10-CM

## 2024-03-27 DIAGNOSIS — F410 Panic disorder [episodic paroxysmal anxiety] without agoraphobia: Secondary | ICD-10-CM

## 2024-03-27 DIAGNOSIS — I214 Non-ST elevation (NSTEMI) myocardial infarction: Secondary | ICD-10-CM

## 2024-03-27 DIAGNOSIS — R030 Elevated blood-pressure reading, without diagnosis of hypertension: Secondary | ICD-10-CM

## 2024-03-27 DIAGNOSIS — Z9861 Coronary angioplasty status: Secondary | ICD-10-CM

## 2024-03-27 DIAGNOSIS — I2510 Atherosclerotic heart disease of native coronary artery without angina pectoris: Secondary | ICD-10-CM | POA: Diagnosis not present

## 2024-03-27 DIAGNOSIS — Z7902 Long term (current) use of antithrombotics/antiplatelets: Secondary | ICD-10-CM

## 2024-03-27 DIAGNOSIS — R55 Syncope and collapse: Secondary | ICD-10-CM

## 2024-03-27 MED ORDER — PROPRANOLOL HCL 10 MG PO TABS: 10.0000 mg | ORAL_TABLET | Freq: Two times a day (BID) | ORAL | 6 refills | Status: AC | PRN

## 2024-03-27 NOTE — Patient Instructions (Addendum)
 Medication Instructions:   Refill for propranolol   *If you need a refill on your cardiac medications before your next appointment, please call your pharmacy*   Lab Work: No needed    Testing/Procedures: Not needed   Follow-Up: At [facility], you and your health needs are our priority.  As part of our continuing mission to provide you with exceptional heart care, we have created designated Provider Care Teams.  These Care Teams include your primary Cardiologist (physician) and Advanced Practice Providers (APPs -  Physician Assistants and Nurse Practitioners) who all work together to provide you with the care you need, when you need it.     Your next appointment:   12 month(s)  The format for your next appointment:   In Person  Provider:   Randene Bustard, MD or Lawana Pray, NP        You have been referred to [facility]- Lipid

## 2024-03-27 NOTE — Progress Notes (Unsigned)
 Cardiology Office Note:  .   Date:  04/02/2024  ID:  Robin Tapia, DOB 04/12/1966, MRN [id number] PCP: Robin Sit, MD  [facility] Providers Cardiologist:  Robin Bustard, MD     Chief Complaint  Patient presents with   Follow-up    Delayed annual follow-up.  Doing well.   Coronary Artery Disease    No active angina.  Only taking aspirin .    Patient Profile: .     Robin Tapia is a otherwise healthy 58 y.o. 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   Referring Provider: Marquetta Sit, MD.  PMH: Non-STEMI (03/12/2013): Large D1 occluded, 95% mid AV groove stenosis.  EF estimated 45 to 50%. Cath-two-vessel PCI: Xience Exp. 3.58mm x 23 mm (post-dil to 3.43mm) - OM1; Xience Exp. 2.25 mm x 15 mm - Diag 2 (post-dil 2.4 mm)  Myoview  05/30/2013: Low risk.  Anterolateral perfusion defect consistent with diagonal branch disease. Hyperlipidemia with statin intolerance as well as intolerance of Zetia and PCSK9 inhibitors => at present, unwilling to take other medications.   Robin Tapia was last seen on chest Cleaver, NP for preop evaluation-surgery to the left elbow.  Apparently she had stopped both aspirin  and Effient  after her fall.  BP was little elevated but on recheck was better.  Notes anxiety coming into the office.  Felt to be relatively low risk for surgery, recommend that she restart aspirin  after her fall.  As of then she was only on aspirin  and vitamins.  She was seen by Dr. Darren Tapia on November 29, 2023.  Noted elevated blood pressures in the office, but well-controlled at home.  Discussed her myalgias and intolerance of multiple statins, Zetia and PCSK9 numbers.  Suggested possibly considering inclisiran.  Subjective  Discussed the use of AI scribe software  for clinical note transcription with the patient, who gave verbal consent to proceed.  History of Present Illness Robin Tapia presents for evaluation of her cholesterol management.  She really has not having any notable cardiac symptoms.  No recent chest pain or pressure. She was recently sick and checked for pneumonia, which was negative.  No angina symptoms of chest pain or pressure at rest or exertion.  No heart failure symptoms of PND, orthopnea or edema.  No syncope or near syncope but she does note some fast heart rate spells.  She is an active runner who recently participated in a half marathon, maintaining an 8:31 pace. She has not participated in any 5K races recently due to a fall last year that resulted in a broken arm. She continues to train and expects to run under eight-minute miles once she resumes racing.  She monitors her blood pressure at home, noting it is generally around 120/73 mmHg but spikes to 158 mmHg with caffeine or stress. She has stopped caffeine intake as she believes it affects her blood pressure. She uses propranolol  sparingly for blood pressure and anxiety and has requested a refill.  Her cholesterol level was recently recorded at 273 mg/dL. She has a history of trying various treatments. She has not been eating meat but is not strict with oils and occasionally consumes chips. Previously, she lost significant weight by avoiding fats, oils, and sugars, reaching 185 pounds, but found it unsustainable.  Aaron Aas Her family history includes parents on statins, suggesting a possible familial component to her  cholesterol issues.    Objective   Only taking aspirin  81 mg and vitamin B 12.  Studies Reviewed: Aaron Aas   EKG Interpretation Date/Time:  Monday Mar 27 2024 08:20:02 EDT Ventricular Rate:  72 PR Interval:  176 QRS Duration:  78 QT Interval:  406 QTC Calculation: 444 R Axis:   62  Text Interpretation: Normal sinus rhythm RSR prime Normal ECG When compared  with ECG of 13-Apr-2013 06:54, No significant change was found Confirmed by Robin Tapia ([id number]) on 03/27/2024 8:29:57 AM    Lab Results  Component Value Date   CHOL 273 (H) 11/15/2023   HDL 56.40 11/15/2023   LDLCALC 179 (H) 11/15/2023   TRIG 186.0 (H) 11/15/2023   CHOLHDL 5 11/15/2023   Lab Results  Component Value Date   NA 141 11/15/2023   K 3.9 11/15/2023   CREATININE 0.80 11/15/2023   GFR 81.44 11/15/2023   GLUCOSE 89 11/15/2023    Risk Assessment/Calculations:           Physical Exam:   VS:  BP 132/74   Pulse 72   Ht 5\' 5"  (1.651 m)   Wt 149 lb 3.2 oz (67.7 kg)   LMP 01/24/2018   SpO2 99%   BMI 24.83 kg/m    Wt Readings from Last 3 Encounters:  03/27/24 149 lb 3.2 oz (67.7 kg)  11/29/23 147 lb 14.4 oz (67.1 kg)  11/15/23 155 lb (70.3 kg)    GEN: Well nourished, well groomed in no acute distress; healthy appearing NECK: No JVD; No carotid bruits CARDIAC: Normal S1, S2; RRR, no murmurs, rubs, gallops RESPIRATORY:  Clear to auscultation without rales, wheezing or rhonchi ; nonlabored, good air movement. ABDOMEN: Soft, non-tender, non-distended EXTREMITIES:  No edema; No deformity      ASSESSMENT AND PLAN: .    Problem List Items Addressed This Visit       Cardiology Problems   CAD (coronary artery disease), native coronary artery - NSTEMI - S/P PCI x 2 of Circumflex and Diag 2 w/ DES 04/12/13 - Primary (Chronic)   Two-vessel CAD with PCI to the OM1 and D2 branches for subtotal occlusion. Preserved EF with no further ischemia on follow-up Myoview .  - Continue aspirin  81 mg daily  (can hold for procedures or surgeries 5 to 7 days preop) - Will refer back to CVRR to discuss consideration for authorization for Leqvio.  She has been intolerant of PCSK9 inhibitors, multiple statins and Zetia.      Relevant Medications   propranolol  (INDERAL ) 10 MG tablet   Other Relevant Orders   EKG 12-Lead (Completed)   AMB Referral to [facility]   Hyperlipidemia  with target low density lipoprotein (LDL) cholesterol less than 55 mg/dL (Chronic)   Persistent hyperlipidemia with cholesterol at 273 mg/dL.  -> Has cited intolerance of atorvastatin , rosuvastatin , simvastatin, Zetia as well as PCSK9 inhibitors.  She decided that she was not able to even lift her legs up enough to step over curbs while on these medications.  She is an active runner and cannot tolerate the side effects.  Discussed Leqvio (inclisiran) as a treatment option. Main side effect is infusion site irritation. Targets liver enzymes without affecting muscle function. . Emphasized familial component and importance of cholesterol management to reduce cardiovascular risk.  She is interested in researching Leqvio further and consulting with the pharmacy team. - Refer to pharmacy team for consultation on Leqvio (inclisiran).      Relevant Medications   propranolol  (INDERAL ) 10 MG tablet  Other Relevant Orders   AMB Referral to [facility]   Near syncope -- with orthostatic hypotension (Chronic)   No further episodes.  Stressed importance of adequate hydration.      Relevant Medications   propranolol  (INDERAL ) 10 MG tablet   Other Relevant Orders   EKG 12-Lead (Completed)   Non-ST elevated myocardial infarction (non-STEMI) (HCC) (Chronic)   11 years out from non-STEMI with two-vessel PCI.  Has not had any issues since.  Follow-up Myoview  later that year showed no ischemia.  She has been very reluctant to take medications-for years was only taking Effient  prior to platelet but has subsequently gone back to just taking aspirin  which is reasonable this far out. She is very active and exercises routinely with no symptoms.  Was intolerant of despite anything for lipid management but is now somewhat in agreement to consider Leqvio but was not able to tolerate Repatha and Praluent.  Thankfully her BP is pretty well-controlled with prescribing as needed propranolol  for  anxiety/hypertension/palpitations.      Relevant Medications   propranolol  (INDERAL ) 10 MG tablet   Other Relevant Orders   EKG 12-Lead (Completed)   Situational hypertension (Chronic)   Hypertension managed with lifestyle modifications. Home blood pressure averages 120/73 mmHg, with spikes to 158 mmHg due to caffeine or stress. Propranolol  previously prescribed for episodic hypertension and anxiety. She requested a refill for occasional use. - Refill propranolol  10 mg up to twice a day as needed for palpitations.      Relevant Medications   propranolol  (INDERAL ) 10 MG tablet   Other Relevant Orders   EKG 12-Lead (Completed)        Follow-Up: Return in about 1 year (around 03/27/2025).      Signed, Arleen Lacer, MD, MS Robin Tapia, M.D., M.S. Interventional Chartered certified accountant  Pager # [phone]

## 2024-04-02 ENCOUNTER — Encounter: Payer: Self-pay | Admitting: Cardiology

## 2024-04-02 NOTE — Assessment & Plan Note (Signed)
 11 years out from non-STEMI with two-vessel PCI.  Has not had any issues since.  Follow-up Myoview  later that year showed no ischemia.  She has been very reluctant to take medications-for years was only taking Effient  prior to platelet but has subsequently gone back to just taking aspirin  which is reasonable this far out. She is very active and exercises routinely with no symptoms.  Was intolerant of despite anything for lipid management but is now somewhat in agreement to consider Leqvio but was not able to tolerate Repatha and Praluent.  Thankfully her BP is pretty well-controlled with prescribing as needed propranolol  for anxiety/hypertension/palpitations.

## 2024-04-02 NOTE — Assessment & Plan Note (Signed)
 Hypertension managed with lifestyle modifications. Home blood pressure averages 120/73 mmHg, with spikes to 158 mmHg due to caffeine or stress. Propranolol  previously prescribed for episodic hypertension and anxiety. She requested a refill for occasional use. - Refill propranolol  10 mg up to twice a day as needed for palpitations.

## 2024-04-02 NOTE — Assessment & Plan Note (Signed)
 Persistent hyperlipidemia with cholesterol at 273 mg/dL.  -> Has cited intolerance of atorvastatin , rosuvastatin , simvastatin, Zetia as well as PCSK9 inhibitors.  She decided that she was not able to even lift her legs up enough to step over curbs while on these medications.  She is an active runner and cannot tolerate the side effects.  Discussed Leqvio (inclisiran) as a treatment option. Main side effect is infusion site irritation. Targets liver enzymes without affecting muscle function. . Emphasized familial component and importance of cholesterol management to reduce cardiovascular risk.  She is interested in researching Leqvio further and consulting with the pharmacy team. - Refer to pharmacy team for consultation on Leqvio (inclisiran).

## 2024-04-02 NOTE — Assessment & Plan Note (Signed)
 No further episodes.  Stressed importance of adequate hydration.

## 2024-04-02 NOTE — Assessment & Plan Note (Signed)
 Two-vessel CAD with PCI to the OM1 and D2 branches for subtotal occlusion. Preserved EF with no further ischemia on follow-up Myoview .  - Continue aspirin  81 mg daily  (can hold for procedures or surgeries 5 to 7 days preop) - Will refer back to CVRR to discuss consideration for authorization for Leqvio.  She has been intolerant of PCSK9 inhibitors, multiple statins and Zetia.

## 2024-06-02 ENCOUNTER — Encounter: Payer: Self-pay | Admitting: Pharmacist

## 2024-06-02 ENCOUNTER — Ambulatory Visit: Attending: Internal Medicine | Admitting: Pharmacist

## 2024-06-02 DIAGNOSIS — E785 Hyperlipidemia, unspecified: Secondary | ICD-10-CM

## 2024-06-02 DIAGNOSIS — G720 Drug-induced myopathy: Secondary | ICD-10-CM | POA: Diagnosis not present

## 2024-06-02 DIAGNOSIS — I2510 Atherosclerotic heart disease of native coronary artery without angina pectoris: Secondary | ICD-10-CM

## 2024-06-02 DIAGNOSIS — T466X5D Adverse effect of antihyperlipidemic and antiarteriosclerotic drugs, subsequent encounter: Secondary | ICD-10-CM

## 2024-06-02 DIAGNOSIS — Z9861 Coronary angioplasty status: Secondary | ICD-10-CM

## 2024-06-02 DIAGNOSIS — T466X5A Adverse effect of antihyperlipidemic and antiarteriosclerotic drugs, initial encounter: Secondary | ICD-10-CM | POA: Insufficient documentation

## 2024-06-02 DIAGNOSIS — I214 Non-ST elevation (NSTEMI) myocardial infarction: Secondary | ICD-10-CM | POA: Diagnosis not present

## 2024-06-02 MED ORDER — NEXLETOL 180 MG PO TABS: 1.0000 | ORAL_TABLET | Freq: Every day | ORAL | Status: AC

## 2024-06-02 NOTE — Patient Instructions (Addendum)
 It was nice meeting you today  The medications we discussed today are called Leqvio (the injection) and Nexletol (a tablet)  I have provided a week of Nexletol samples to see if you are able to tolerate  I will also submit the Leqvio enrollment form and contact you with their response  Please send me update via myChart  Medford Bolk, PharmD, BCACP, CDCES, CPP [facility] [address] [place], [place] [address] Phone: [phone]; Fax: [phone] 06/02/2024 9:34 AM

## 2024-06-02 NOTE — Progress Notes (Signed)
 Patient ID: Robin Tapia                 DOB: Sep 28, 1966                    MRN: [id number]     HPI: Robin Tapia is a 58 y.o. female patient referred to lipid clinic by Dr Anner. PMH is significant for CAD, NSTEMI, HTN, and HLD. Patient has a history of medication intolerances.  Patient presents today to discuss lipid management. Intolerant to statins. Has tried both atorvastatin  and rosuvastatin .  Previously enrolled in clinical trial for a PCSK9i through [employer]. Did not know name of medication. From chart notes the medication was bococizumab which [employer] stopped development on in 2016. Has not tried Repatha/Praluent.  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 Does not want any medications which would interfere with her physical activity.   Current Medications: N/A  Intolerances:  Rosuvastatin  Atorvastatin  PCS9i study drug  Risk Factors:  CAD NSTEMI  LDL goal: <55  Labs: TC 273, Trigs 186, HDL 56.4, LDL 179 (11/15/23)  Past Medical History:  Diagnosis Date   Anxiety    not on meds   CAD S/P percutaneous coronary angioplasty 04/2013   - 100% D2, ~90% Cx-OM1; EF 45-50%   Dyslipidemia, goal LDL below 70    Reduced to 5 mg Crestor    Fainting spell    History of pneumonia ~2002   walking PNA   NSTEMI - S/P PCI and stenting x 2 of Circumflex and Diagonal Branch w/ DES 04/12/13 04/12/2013   Pathophysiology consistent with STEMI with occluded diagonal branch he   Piriformis muscle pain    LLE   Presence of drug coated stent in a Diagonal branch of the LAD coronary artery 04/12/2013   Xence EXpd DES 2.25 mm x 15 mm - post-dil to 2.4 mm   Presence of drug coated stent in left circumflex coronary artery 04/12/2013   Xience EXpd DES 3.0 mm x 23 mm - post-dil to 3.25 mm    Current Outpatient Medications on File Prior to Visit  Medication Sig Dispense Refill   aspirin  EC 81 MG tablet Take 1 tablet (81 mg total) by mouth  every other day. (Patient taking differently: Take 81 mg by mouth daily.) 90 tablet 3   propranolol  (INDERAL ) 10 MG tablet Take 1 tablet (10 mg total) by mouth 2 (two) times daily as needed. for palp. 30 tablet 6   vitamin B-12 (CYANOCOBALAMIN) 100 MCG tablet Take 100 mcg by mouth daily.     No current facility-administered medications on file prior to visit.    Allergies  Allergen Reactions   Crestor  [Rosuvastatin ]     Muscle aches at 10 mg daily, 5 mg daily, and 5 mg twice weekly   Lipitor  [Atorvastatin ]     Muscle aches     Assessment/Plan:  1. Hyperlipidemia - Patient last LDL 179 is above goal of <55. Intolerant to most lipid lowering agents. Discussed next options with patient including Leqvio and bempedoic acid. Discussed administration schedule and possible side effects of Leqvio. Patient willing to see what copay is. Will send to service center.  Patient has not tried bempedoic acid either. Will give 7 day sample to see if she has side effects. Patient will report back next week. Uric acid level ordered for 2 weeks.  Will contact patient when Leqvio enrollment form is returned.  Chris Arcadio Cope, PharmD, BCACP, CDCES, CPP  [facility] [address], [place], [place] [address] Phone: [phone]; Fax: [phone] 06/02/2024 12:06 PM

## 2024-06-07 ENCOUNTER — Telehealth: Payer: Self-pay | Admitting: Pharmacist

## 2024-06-07 ENCOUNTER — Other Ambulatory Visit (HOSPITAL_COMMUNITY): Payer: Self-pay

## 2024-06-07 NOTE — Telephone Encounter (Signed)
Leqvio enrollment form faxed to service center 

## 2024-06-09 ENCOUNTER — Encounter: Payer: Self-pay | Admitting: Cardiology

## 2024-06-09 NOTE — Addendum Note (Signed)
 Addended by: DARRELL BRUCKNER on: 06/09/2024 11:17 AM   Modules accepted: Orders

## 2024-06-12 ENCOUNTER — Telehealth: Payer: Self-pay | Admitting: Pharmacy Technician

## 2024-06-12 NOTE — Telephone Encounter (Signed)
 Medford, Patient has been approved for leqvio and will be scheduled as soon as possible.  Auth Submission: APPROVED Site of care: Site of care: [facility] Payer: [employer] [place] Medication & CPT/J Code(s) submitted: Leqvio (Inclisiran) [id number] Diagnosis Code:  Route of submission (phone, fax, portal):  Phone #[phone] Fax #[phone] Auth type: Buy/Bill PB Units/visits requested: X2 DOSES Q6MONTHS Reference number: [id number] Approval from: 06/09/24 to 12/05/24    Co-pay card; [employer] aware

## 2024-06-27 ENCOUNTER — Ambulatory Visit (INDEPENDENT_AMBULATORY_CARE_PROVIDER_SITE_OTHER)

## 2024-06-27 VITALS — BP 135/89 | HR 70 | Temp 97.7°F | Resp 18 | Ht 65.0 in | Wt 152.2 lb

## 2024-06-27 DIAGNOSIS — I214 Non-ST elevation (NSTEMI) myocardial infarction: Secondary | ICD-10-CM

## 2024-06-27 DIAGNOSIS — E785 Hyperlipidemia, unspecified: Secondary | ICD-10-CM | POA: Diagnosis not present

## 2024-06-27 DIAGNOSIS — I2510 Atherosclerotic heart disease of native coronary artery without angina pectoris: Secondary | ICD-10-CM

## 2024-06-27 MED ORDER — INCLISIRAN SODIUM 284 MG/1.5ML ~~LOC~~ SOSY
284.0000 mg | PREFILLED_SYRINGE | Freq: Once | SUBCUTANEOUS | Status: AC
Start: 2024-06-27 — End: 2024-06-27
  Administered 2024-06-27: 284 mg via SUBCUTANEOUS
  Filled 2024-06-27: qty 1.5

## 2024-06-27 NOTE — Progress Notes (Signed)
 Diagnosis: Hyperlipidemia  Provider:  Mannam, Praveen MD  Procedure: Injection  Leqvio  (inclisiran), Dose: 284 mg, Site: subcutaneous, Number of injections: 1  Injection Site(s): Left upper quad. abdomen  Post Care: Observation period completed  Discharge: Condition: Good, Destination: Home . AVS Declined  Performed by:  Rocky FORBES Sar, RN

## 2024-06-30 DIAGNOSIS — Z01419 Encounter for gynecological examination (general) (routine) without abnormal findings: Secondary | ICD-10-CM | POA: Diagnosis not present

## 2024-06-30 DIAGNOSIS — Z6826 Body mass index (BMI) 26.0-26.9, adult: Secondary | ICD-10-CM | POA: Diagnosis not present

## 2024-09-21 ENCOUNTER — Encounter: Payer: Self-pay | Admitting: Cardiology

## 2024-09-25 ENCOUNTER — Encounter: Payer: Self-pay | Admitting: Cardiology

## 2024-09-27 ENCOUNTER — Telehealth: Payer: Self-pay | Admitting: Pharmacy Technician

## 2024-09-27 NOTE — Telephone Encounter (Signed)
 F/u: Leqvio  bill;  Patient has been approved for the Leqvio  co-pay card. Per [employer] the card was applied to patient bill DOS 06/27/24 in the amount of $1,075.62.  (check # [id number])  Patient called for a f/u on the outstanding bill. I have spoken with patient and she understood and had no further questions.   Luke

## 2024-09-28 ENCOUNTER — Telehealth: Payer: Self-pay | Admitting: Pharmacy Technician

## 2024-09-28 ENCOUNTER — Ambulatory Visit (INDEPENDENT_AMBULATORY_CARE_PROVIDER_SITE_OTHER)

## 2024-09-28 ENCOUNTER — Encounter: Payer: Self-pay | Admitting: Cardiology

## 2024-09-28 VITALS — BP 169/93 | HR 78 | Temp 97.3°F | Resp 12 | Ht 65.0 in | Wt 155.0 lb

## 2024-09-28 DIAGNOSIS — E785 Hyperlipidemia, unspecified: Secondary | ICD-10-CM | POA: Diagnosis not present

## 2024-09-28 DIAGNOSIS — Z9861 Coronary angioplasty status: Secondary | ICD-10-CM

## 2024-09-28 DIAGNOSIS — I2510 Atherosclerotic heart disease of native coronary artery without angina pectoris: Secondary | ICD-10-CM

## 2024-09-28 DIAGNOSIS — I214 Non-ST elevation (NSTEMI) myocardial infarction: Secondary | ICD-10-CM

## 2024-09-28 MED ORDER — INCLISIRAN SODIUM 284 MG/1.5ML ~~LOC~~ SOSY
284.0000 mg | PREFILLED_SYRINGE | Freq: Once | SUBCUTANEOUS | Status: AC
  Administered 2024-09-28: 284 mg via SUBCUTANEOUS
  Filled 2024-09-28: qty 1.5

## 2024-09-28 NOTE — Telephone Encounter (Addendum)
 Patient called yesterday 09/27/24 in regards to her co-pay card.  Email was sent [employer] for an up date. Per [employer] the co-pay card has been applied.  New auth was submitted due to new insurance with Robin Tapia Submission: APPROVED Site of care: Site of care: [facility] Payer: [employer] Medication & CPT/J Code(s) submitted: Leqvio  (Inclisiran) [id number] Diagnosis Code: i25, e78.5 Route of submission (phone, fax, portal): fax Phone # Fax # Auth type: Buy/Bill PB Units/visits requested: 284mg  q6 months Reference number: [id number] Approval from: 09/28/24 - 09/27/25

## 2024-09-28 NOTE — Progress Notes (Signed)
 Diagnosis: Hyperlipidemia  Provider:  Chilton Greathouse MD  Procedure: Injection  Leqvio (inclisiran), Dose: 284 mg, Site: subcutaneous, Number of injections: 1  Injection Site(s): Right lower quad. abdomne  Post Care:  right lower quad abdominal injection  Discharge: Condition: Good, Destination: Home . AVS Provided  Performed by:  Rico Ala, LPN

## 2024-10-02 ENCOUNTER — Encounter: Payer: Self-pay | Admitting: Cardiology

## 2025-03-29 ENCOUNTER — Ambulatory Visit
